# Patient Record
Sex: Male | Born: 1965 | Race: White | Hispanic: No | Marital: Married | Smoking: Current some day smoker
Health system: Southern US, Community
[De-identification: ages and names within clinical notes are randomized; demographics above are authoritative.]

## PROBLEM LIST (undated history)

## (undated) DIAGNOSIS — I491 Atrial premature depolarization: Secondary | ICD-10-CM

## (undated) DIAGNOSIS — F419 Anxiety disorder, unspecified: Secondary | ICD-10-CM

## (undated) DIAGNOSIS — E669 Obesity, unspecified: Secondary | ICD-10-CM

## (undated) DIAGNOSIS — E119 Type 2 diabetes mellitus without complications: Secondary | ICD-10-CM

## (undated) DIAGNOSIS — I499 Cardiac arrhythmia, unspecified: Secondary | ICD-10-CM

## (undated) DIAGNOSIS — K2100 Gastro-esophageal reflux disease with esophagitis, without bleeding: Secondary | ICD-10-CM

## (undated) DIAGNOSIS — F329 Major depressive disorder, single episode, unspecified: Secondary | ICD-10-CM

## (undated) DIAGNOSIS — A4902 Methicillin resistant Staphylococcus aureus infection, unspecified site: Secondary | ICD-10-CM

## (undated) DIAGNOSIS — E7800 Pure hypercholesterolemia, unspecified: Secondary | ICD-10-CM

## (undated) DIAGNOSIS — K219 Gastro-esophageal reflux disease without esophagitis: Secondary | ICD-10-CM

## (undated) DIAGNOSIS — F32A Depression, unspecified: Secondary | ICD-10-CM

## (undated) DIAGNOSIS — G4730 Sleep apnea, unspecified: Secondary | ICD-10-CM

## (undated) DIAGNOSIS — M1990 Unspecified osteoarthritis, unspecified site: Secondary | ICD-10-CM

## (undated) DIAGNOSIS — I10 Essential (primary) hypertension: Secondary | ICD-10-CM

## (undated) DIAGNOSIS — M25519 Pain in unspecified shoulder: Secondary | ICD-10-CM

## (undated) DIAGNOSIS — R030 Elevated blood-pressure reading, without diagnosis of hypertension: Secondary | ICD-10-CM

## (undated) DIAGNOSIS — Z720 Tobacco use: Secondary | ICD-10-CM

## (undated) HISTORY — PX: ABDOMINAL HYSTERECTOMY: SHX81

## (undated) HISTORY — PX: HERNIA REPAIR: SHX51

## (undated) HISTORY — DX: Type 2 diabetes mellitus without complications: E11.9

---
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
## 2005-05-20 ENCOUNTER — Emergency Department: Payer: Self-pay | Admitting: Emergency Medicine

## 2006-04-16 ENCOUNTER — Emergency Department: Payer: Self-pay

## 2008-11-11 ENCOUNTER — Emergency Department: Payer: Self-pay | Admitting: Emergency Medicine

## 2011-03-25 ENCOUNTER — Emergency Department: Payer: Self-pay | Admitting: Emergency Medicine

## 2013-12-16 ENCOUNTER — Emergency Department: Payer: Self-pay | Admitting: Emergency Medicine
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
## 2019-03-10 ENCOUNTER — Encounter: Payer: Self-pay | Admitting: Emergency Medicine

## 2019-03-10 ENCOUNTER — Ambulatory Visit
Admission: EM | Admit: 2019-03-10 | Discharge: 2019-03-10 | Disposition: A | Discharge status: Home / Self Care | Payer: BC Managed Care – PPO | Attending: Emergency Medicine | Admitting: Emergency Medicine

## 2019-03-10 ENCOUNTER — Other Ambulatory Visit: Payer: Self-pay

## 2019-03-10 DIAGNOSIS — Z20822 Contact with and (suspected) exposure to covid-19: Secondary | ICD-10-CM

## 2019-03-10 DIAGNOSIS — F17210 Nicotine dependence, cigarettes, uncomplicated: Secondary | ICD-10-CM | POA: Diagnosis not present

## 2019-03-10 DIAGNOSIS — I10 Essential (primary) hypertension: Secondary | ICD-10-CM

## 2019-03-10 HISTORY — DX: Gastro-esophageal reflux disease with esophagitis, without bleeding: K21.00

## 2019-03-10 HISTORY — DX: Essential (primary) hypertension: I10

## 2019-03-10 HISTORY — DX: Tobacco use: Z72.0

## 2019-03-10 HISTORY — DX: Type 2 diabetes mellitus without complications: E11.9

## 2019-03-10 HISTORY — DX: Depression, unspecified: F32.A

## 2019-03-10 HISTORY — DX: Obesity, unspecified: E66.9

## 2019-03-10 NOTE — ED Triage Notes (Signed)
Patient in office today stated that he was exposed to a coworker whom tested positive for covid at [facility].   Denies: any symptom

## 2019-03-10 NOTE — ED Provider Notes (Signed)
Andrew Whitehead    CSN: [id number] Arrival date & time: 03/10/19  1027      History   Chief Complaint Chief Complaint  Patient presents with  . Possibel exposure covid    HPI Andrew Whitehead is a 54 y.o. male.  Presents with request for a COVID test.  He was exposed to a positive coworker.  He denies symptoms, including fever, chills, congestion, cough, shortness of breath, vomiting, diarrhea, rash, or other concerns.  No treatments attempted at home.    The history is provided by the patient.    Past Medical History:  Diagnosis Date  . Depression   . Diabetes mellitus without complication (HCC)   . Gastro-esophageal reflux disease with esophagitis   . Hypertension   . Obesity   . Tobacco abuse     There are no problems to display for this patient.   History reviewed. No pertinent surgical history.     Home Medications    Prior to Admission medications   Medication Sig Start Date End Date Taking? Authorizing Provider  amLODipine (NORVASC) 10 MG tablet Take by mouth. 06/11/18  Yes [provider]  aspirin 81 MG EC tablet Take by mouth. 10/04/14  Yes [provider]  Blood Glucose Monitoring Suppl (GLUCOCOM BLOOD GLUCOSE MONITOR) DEVI by Does not apply route. 04/02/18  Yes [provider]  citalopram (CELEXA) 40 MG tablet Take by mouth. 06/11/18 06/11/19 Yes [provider]  esomeprazole (NEXIUM) 20 MG capsule Take by mouth. 06/11/18  Yes [provider]  ezetimibe (ZETIA) 10 MG tablet Take by mouth. 11/07/18  Yes [provider]  glucose blood (FREESTYLE INSULINX TEST) test strip by Other route Three (3) times a day before meals. 06/11/18  Yes [provider]  Insulin Glargine (LANTUS) 100 UNIT/ML Solostar Pen Inject into the skin. 06/11/18 06/11/19 Yes [provider]  Lancets MISC Disp. lancets #100 or amount allowed, Testing TID, Dx: E11, Z79.4. (DM Type 2, Insulin Dep) 06/11/18 06/11/19 Yes  [provider]  liraglutide (VICTOZA) 18 MG/3ML SOPN Inject into the skin. 06/11/18 06/11/19 Yes [provider]  metFORMIN (GLUCOPHAGE) 500 MG tablet Take by mouth. 06/11/18  Yes [provider]    Family History Family History  Problem Relation Age of Onset  . Healthy Mother     Social History Social History   Tobacco Use  . Smoking status: Current Some Day Smoker  . Smokeless tobacco: Never Used  Substance Use Topics  . Alcohol use: Not on file  . Drug use: Not on file     Allergies   Patient has no known allergies.   Review of Systems Review of Systems  Constitutional: Negative for chills and fever.  HENT: Negative for congestion, ear pain, rhinorrhea and sore throat.   Eyes: Negative for pain and visual disturbance.  Respiratory: Negative for cough and shortness of breath.   Cardiovascular: Negative for chest pain and palpitations.  Gastrointestinal: Negative for abdominal pain, diarrhea, nausea and vomiting.  Genitourinary: Negative for dysuria and hematuria.  Musculoskeletal: Negative for arthralgias and back pain.  Skin: Negative for color change and rash.  Neurological: Negative for seizures and syncope.  All other systems reviewed and are negative.    Physical Exam Triage Vital Signs ED Triage Vitals  Enc Vitals Group     BP      Pulse      Resp      Temp      Temp src  SpO2      Weight      Height      Head Circumference      Peak Flow      Pain Score      Pain Loc      Pain Edu?      Excl. in GC?    No data found.  Updated Vital Signs BP (!) 164/89 (BP Location: Left Arm)   Pulse 77   Temp 99.1 F (37.3 C) (Oral)   Resp 18   Ht 5\' 9"  (1.753 m)   Wt 260 lb (117.9 kg)   SpO2 97%   BMI 38.40 kg/m   Visual Acuity Right Eye Distance:   Left Eye Distance:   Bilateral Distance:    Right Eye Near:   Left Eye Near:    Bilateral Near:     Physical Exam Vitals and nursing note reviewed.    Constitutional:      General: He is not in acute distress.    Appearance: He is well-developed. He is not ill-appearing.  HENT:     Head: Normocephalic and atraumatic.     Right Ear: Tympanic membrane normal.     Left Ear: Tympanic membrane normal.     Nose: Nose normal.     Mouth/Throat:     Mouth: Mucous membranes are moist.     Pharynx: Oropharynx is clear.  Eyes:     Conjunctiva/sclera: Conjunctivae normal.  Cardiovascular:     Rate and Rhythm: Normal rate and regular rhythm.     Heart sounds: No murmur.  Pulmonary:     Effort: Pulmonary effort is normal. No respiratory distress.     Breath sounds: Normal breath sounds. No wheezing or rhonchi.  Abdominal:     General: Bowel sounds are normal.     Palpations: Abdomen is soft.     Tenderness: There is no abdominal tenderness. There is no guarding or rebound.  Musculoskeletal:     Cervical back: Neck supple.  Skin:    General: Skin is warm and dry.     Findings: No rash.  Neurological:     General: No focal deficit present.     Mental Status: He is alert and oriented to person, place, and time.  Psychiatric:        Mood and Affect: Mood normal.        Behavior: Behavior normal.      UC Treatments / Results  Labs (all labs ordered are listed, but only abnormal results are displayed) Labs Reviewed  NOVEL CORONAVIRUS, NAA    EKG   Radiology No results found.  Procedures Procedures (including critical care time)  Medications Ordered in UC Medications - No data to display  Initial Impression / Assessment and Plan / UC Course  I have reviewed the triage vital signs and the nursing notes.  Pertinent labs & imaging results that were available during my care of the patient were reviewed by me and considered in my medical decision making (see chart for details).   Exposure to COVID.  Elevated blood pressure with known HTN.  COVID test performed here.  Instructed patient to self quarantine until the test result  is back.  Discussed with patient that he can take Tylenol as needed for fever or discomfort.  Instructed patient to go to the emergency department if he develops high fever, shortness of breath, severe diarrhea, or other concerning symptoms.  Discussed that his blood pressure is elevated today and needs to be  rechecked by his PCP in 2-4 weeks.  Patient agrees with plan of care.    Final Clinical Impressions(s) / UC Diagnoses   Final diagnoses:  Exposure to COVID-19 virus  Elevated blood pressure reading in office with diagnosis of hypertension     Discharge Instructions     Your COVID test is pending.  You should self quarantine until your test result is back and is negative.    Take Tylenol as needed for fever or discomfort.  Rest and keep yourself hydrated.    Go to the emergency department if you develop high fever, shortness of breath, severe diarrhea, or other concerning symptoms.    Your blood pressure is elevated today at 164/89.  Please have this rechecked by your primary care provider in 2-4 weeks.          ED Prescriptions    None     PDMP not reviewed this encounter.   Sharion Balloon, NP 03/10/19 1053

## 2019-03-10 NOTE — Discharge Instructions (Addendum)
Your COVID test is pending.  You should self quarantine until your test result is back and is negative.    Take Tylenol as needed for fever or discomfort.  Rest and keep yourself hydrated.    Go to the emergency department if you develop high fever, shortness of breath, severe diarrhea, or other concerning symptoms.    Your blood pressure is elevated today at 164/89.  Please have this rechecked by your primary care provider in 2-4 weeks.

## 2019-03-11 LAB — NOVEL CORONAVIRUS, NAA: SARS-CoV-2, NAA: NOT DETECTED

## 2022-03-05 ENCOUNTER — Other Ambulatory Visit: Payer: Self-pay | Admitting: Sports Medicine

## 2022-03-05 DIAGNOSIS — W19XXXA Unspecified fall, initial encounter: Secondary | ICD-10-CM

## 2022-03-05 DIAGNOSIS — S46011A Strain of muscle(s) and tendon(s) of the rotator cuff of right shoulder, initial encounter: Secondary | ICD-10-CM

## 2022-03-05 DIAGNOSIS — M7551 Bursitis of right shoulder: Secondary | ICD-10-CM

## 2022-03-05 DIAGNOSIS — M7541 Impingement syndrome of right shoulder: Secondary | ICD-10-CM

## 2022-03-05 DIAGNOSIS — G8929 Other chronic pain: Secondary | ICD-10-CM

## 2022-07-31 ENCOUNTER — Other Ambulatory Visit: Payer: Self-pay | Admitting: Orthopedic Surgery

## 2022-07-31 DIAGNOSIS — M5412 Radiculopathy, cervical region: Secondary | ICD-10-CM

## 2022-07-31 DIAGNOSIS — M5030 Other cervical disc degeneration, unspecified cervical region: Secondary | ICD-10-CM

## 2022-07-31 DIAGNOSIS — M4802 Spinal stenosis, cervical region: Secondary | ICD-10-CM

## 2022-08-02 ENCOUNTER — Other Ambulatory Visit: Payer: Self-pay | Admitting: Orthopedic Surgery

## 2022-08-02 DIAGNOSIS — G8929 Other chronic pain: Secondary | ICD-10-CM

## 2022-08-02 DIAGNOSIS — S46011A Strain of muscle(s) and tendon(s) of the rotator cuff of right shoulder, initial encounter: Secondary | ICD-10-CM

## 2022-08-02 DIAGNOSIS — M7551 Bursitis of right shoulder: Secondary | ICD-10-CM

## 2022-08-02 DIAGNOSIS — W19XXXA Unspecified fall, initial encounter: Secondary | ICD-10-CM

## 2022-08-02 DIAGNOSIS — M7541 Impingement syndrome of right shoulder: Secondary | ICD-10-CM

## 2022-08-16 ENCOUNTER — Ambulatory Visit
Admission: RE | Admit: 2022-08-16 | Discharge: 2022-08-16 | Disposition: A | Discharge status: Home / Self Care | Payer: Managed Care, Other (non HMO) | Source: Ambulatory Visit | Attending: Orthopedic Surgery | Admitting: Orthopedic Surgery

## 2022-08-16 DIAGNOSIS — G8929 Other chronic pain: Secondary | ICD-10-CM

## 2022-08-16 DIAGNOSIS — M5030 Other cervical disc degeneration, unspecified cervical region: Secondary | ICD-10-CM

## 2022-08-16 DIAGNOSIS — S46011A Strain of muscle(s) and tendon(s) of the rotator cuff of right shoulder, initial encounter: Secondary | ICD-10-CM

## 2022-08-16 DIAGNOSIS — M5412 Radiculopathy, cervical region: Secondary | ICD-10-CM

## 2022-08-16 DIAGNOSIS — M4802 Spinal stenosis, cervical region: Secondary | ICD-10-CM

## 2022-09-02 ENCOUNTER — Other Ambulatory Visit: Payer: Self-pay | Admitting: Orthopedic Surgery

## 2022-09-09 ENCOUNTER — Inpatient Hospital Stay
Admission: RE | Admit: 2022-09-09 | Discharge: 2022-09-09 | Disposition: A | Discharge status: Home / Self Care | Payer: Managed Care, Other (non HMO) | Source: Ambulatory Visit

## 2022-09-09 HISTORY — DX: Gastro-esophageal reflux disease without esophagitis: K21.9

## 2022-09-09 HISTORY — DX: Pure hypercholesterolemia, unspecified: E78.00

## 2022-09-09 HISTORY — DX: Methicillin resistant Staphylococcus aureus infection, unspecified site: A49.02

## 2022-09-10 ENCOUNTER — Encounter
Admission: RE | Admit: 2022-09-10 | Discharge: 2022-09-10 | Disposition: A | Discharge status: Home / Self Care | Payer: Managed Care, Other (non HMO) | Source: Ambulatory Visit | Attending: Orthopedic Surgery | Admitting: Orthopedic Surgery

## 2022-09-10 ENCOUNTER — Other Ambulatory Visit: Payer: Self-pay

## 2022-09-10 DIAGNOSIS — Z794 Long term (current) use of insulin: Secondary | ICD-10-CM

## 2022-09-10 DIAGNOSIS — I10 Essential (primary) hypertension: Secondary | ICD-10-CM

## 2022-09-10 NOTE — Patient Instructions (Addendum)
Your procedure is scheduled on: Tuesday 09/17/22 To find out your arrival time, please call [phone] between 1PM - 3PM on: Monday 09/16/22   Report to the Registration Desk on the 1st floor of the [facility]. FREE Valet parking is available.  If your arrival time is 6:00 am, do not arrive before that time as the [facility] entrance doors do not open until 6:00 am.  REMEMBER: Instructions that are not followed completely may result in serious medical risk, up to and including death; or upon the discretion of your surgeon and anesthesiologist your surgery may need to be rescheduled.  Do not eat food after midnight the night before surgery.  No gum chewing or hard candies.  You may however, drink CLEAR liquids up to 2 hours before you are scheduled to arrive for your surgery. Do not drink anything within 2 hours of your scheduled arrival time.  Type 1 and Type 2 diabetics should only drink water.  In addition, your doctor has ordered for you to drink the provided:   Gatorade G2 Drinking this carbohydrate drink up to two hours before surgery helps to reduce insulin resistance and improve patient outcomes. Please complete drinking 2 hours before scheduled arrival time.  One week prior to surgery: Stop Anti-inflammatories (NSAIDS) such as Advil, Aleve, Ibuprofen, Motrin, Naproxen, Naprosyn and Aspirin based products such as Excedrin, Goody's Powder, BC Powder. You may however, continue to take Tylenol if needed for pain up until the day of surgery.  Stop ANY OVER THE COUNTER supplements and vitamins until after surgery.  Continue taking all prescribed medications except: metFORMIN (GLUCOPHAGE) 500 MG tablet, hold for 2 days, last dose Saturday night 09/14/22  TAKE ONLY THESE MEDICATIONS THE MORNING OF SURGERY WITH A SIP OF WATER:  amLODipine (NORVASC) 10 MG tablet famotidine (PEPCID) 20 MG tablet Antacid (take one the night before and one on the morning of surgery - helps to prevent  nausea after surgery.)  ezetimibe (ZETIA) 10 MG tablet  citalopram (CELEXA) 40 MG tablet   No Alcohol for 24 hours before or after surgery.  No Smoking including e-cigarettes for 24 hours before surgery.  No chewable tobacco products for at least 6 hours before surgery.  No nicotine patches on the day of surgery.  Do not use any "recreational" drugs for at least a week (preferably 2 weeks) before your surgery.  Please be advised that the combination of cocaine and anesthesia may have negative outcomes, up to and including death. If you test positive for cocaine, your surgery will be cancelled.  On the morning of surgery brush your teeth with toothpaste and water, you may rinse your mouth with mouthwash if you wish. Do not swallow any toothpaste or mouthwash.  Use CHG Soap or wipes as directed on instruction sheet.  Do not wear lotions, powders, or perfumes.   Do not shave body hair from the neck down 48 hours before surgery.  Wear comfortable clothing (specific to your surgery type) to the hospital.  Do not wear jewelry, make-up, hairpins, clips or nail polish.  Contact lenses, hearing aids and dentures may not be worn into surgery.  Do not bring valuables to the hospital. [facility] is not responsible for any missing/lost belongings or valuables.   Notify your doctor if there is any change in your medical condition (cold, fever, infection).  If you are being discharged the day of surgery, you will not be allowed to drive home. You will need a responsible individual to drive  you home and stay with you for 24 hours after surgery.   If you are taking public transportation, you will need to have a responsible individual with you.  If you are being admitted to the hospital overnight, leave your suitcase in the car. After surgery it may be brought to your room.  In case of increased patient census, it may be necessary for you, the patient, to continue your postoperative care in  the Same Day Surgery department.  After surgery, you can help prevent lung complications by doing breathing exercises.  Take deep breaths and cough every 1-2 hours. Your doctor may order a device called an Incentive Spirometer to help you take deep breaths. When coughing or sneezing, hold a pillow firmly against your incision with both hands. This is called "splinting." Doing this helps protect your incision. It also decreases belly discomfort.  Surgery Visitation Policy:  Patients undergoing a surgery or procedure may have two family members or support persons with them as long as the person is not COVID-19 positive or experiencing its symptoms.   Inpatient Visitation:    Visiting hours are 7 a.m. to 8 p.m. Up to four visitors are allowed at one time in a patient room. The visitors may rotate out with other people during the day. One designated support person (adult) may remain overnight.  Due to an increase in RSV and influenza rates and associated hospitalizations, children ages 10 and under will not be able to visit patients in [facility]. Masks continue to be strongly recommended.  Please call the Pre-admissions Testing Dept. at [phone] if you have any questions about these instructions.     Preparing for Surgery with CHLORHEXIDINE GLUCONATE (CHG) Soap  Chlorhexidine Gluconate (CHG) Soap  o An antiseptic cleaner that kills germs and bonds with the skin to continue killing germs even after washing  o Used for showering the night before surgery and morning of surgery  Before surgery, you can play an important role by reducing the number of germs on your skin.  CHG (Chlorhexidine gluconate) soap is an antiseptic cleanser which kills germs and bonds with the skin to continue killing germs even after washing.  Please do not use if you have an allergy to CHG or antibacterial soaps. If your skin becomes reddened/irritated stop using the CHG.  1. Shower the NIGHT  BEFORE SURGERY and the MORNING OF SURGERY with CHG soap.  2. If you choose to wash your hair, wash your hair first as usual with your normal shampoo.  3. After shampooing, rinse your hair and body thoroughly to remove the shampoo.  4. Use CHG as you would any other liquid soap. You can apply CHG directly to the skin and wash gently with a scrungie or a clean washcloth.  5. Apply the CHG soap to your body only from the neck down. Do not use on open wounds or open sores. Avoid contact with your eyes, ears, mouth, and genitals (private parts). Wash face and genitals (private parts) with your normal soap.  6. Wash thoroughly, paying special attention to the area where your surgery will be performed.  7. Thoroughly rinse your body with warm water.  8. Do not shower/wash with your normal soap after using and rinsing off the CHG soap.  9. Pat yourself dry with a clean towel.  10. Wear clean pajamas to bed the night before surgery.  12. Place clean sheets on your bed the night of your first shower and do not sleep  with pets.  13. Shower again with the CHG soap on the day of surgery prior to arriving at the hospital.  14. Do not apply any deodorants/lotions/powders.  15. Please wear clean clothes to the hospital.

## 2022-09-11 ENCOUNTER — Encounter: Payer: Self-pay | Admitting: Urgent Care

## 2022-09-11 ENCOUNTER — Encounter
Admission: RE | Admit: 2022-09-11 | Discharge: 2022-09-11 | Disposition: A | Discharge status: Home / Self Care | Payer: Managed Care, Other (non HMO) | Source: Ambulatory Visit | Attending: Orthopedic Surgery | Admitting: Orthopedic Surgery

## 2022-09-11 DIAGNOSIS — E119 Type 2 diabetes mellitus without complications: Secondary | ICD-10-CM | POA: Diagnosis not present

## 2022-09-11 DIAGNOSIS — I10 Essential (primary) hypertension: Secondary | ICD-10-CM | POA: Diagnosis not present

## 2022-09-11 DIAGNOSIS — Z01818 Encounter for other preprocedural examination: Secondary | ICD-10-CM | POA: Diagnosis present

## 2022-09-11 DIAGNOSIS — Z01810 Encounter for preprocedural cardiovascular examination: Secondary | ICD-10-CM | POA: Insufficient documentation

## 2022-09-11 DIAGNOSIS — Z794 Long term (current) use of insulin: Secondary | ICD-10-CM | POA: Insufficient documentation

## 2022-09-17 ENCOUNTER — Encounter: Payer: Self-pay | Admitting: Orthopedic Surgery

## 2022-09-17 ENCOUNTER — Ambulatory Visit: Payer: Managed Care, Other (non HMO) | Admitting: Urgent Care

## 2022-09-17 ENCOUNTER — Other Ambulatory Visit: Payer: Self-pay

## 2022-09-17 ENCOUNTER — Ambulatory Visit
Admission: RE | Admit: 2022-09-17 | Discharge: 2022-09-17 | Disposition: A | Discharge status: Home / Self Care | Payer: Managed Care, Other (non HMO) | Attending: Orthopedic Surgery | Admitting: Orthopedic Surgery

## 2022-09-17 ENCOUNTER — Encounter
Admission: RE | Disposition: A | Discharge status: Home / Self Care | Payer: Self-pay | Source: Home / Self Care | Attending: Orthopedic Surgery

## 2022-09-17 ENCOUNTER — Ambulatory Visit: Payer: Managed Care, Other (non HMO)

## 2022-09-17 DIAGNOSIS — I10 Essential (primary) hypertension: Secondary | ICD-10-CM | POA: Insufficient documentation

## 2022-09-17 DIAGNOSIS — M7521 Bicipital tendinitis, right shoulder: Secondary | ICD-10-CM | POA: Diagnosis not present

## 2022-09-17 DIAGNOSIS — S46019A Strain of muscle(s) and tendon(s) of the rotator cuff of unspecified shoulder, initial encounter: Secondary | ICD-10-CM | POA: Diagnosis present

## 2022-09-17 DIAGNOSIS — Z6836 Body mass index (BMI) 36.0-36.9, adult: Secondary | ICD-10-CM | POA: Diagnosis not present

## 2022-09-17 DIAGNOSIS — K219 Gastro-esophageal reflux disease without esophagitis: Secondary | ICD-10-CM | POA: Insufficient documentation

## 2022-09-17 DIAGNOSIS — E119 Type 2 diabetes mellitus without complications: Secondary | ICD-10-CM | POA: Insufficient documentation

## 2022-09-17 DIAGNOSIS — M19011 Primary osteoarthritis, right shoulder: Secondary | ICD-10-CM | POA: Insufficient documentation

## 2022-09-17 DIAGNOSIS — E669 Obesity, unspecified: Secondary | ICD-10-CM | POA: Diagnosis not present

## 2022-09-17 DIAGNOSIS — W010XXA Fall on same level from slipping, tripping and stumbling without subsequent striking against object, initial encounter: Secondary | ICD-10-CM | POA: Insufficient documentation

## 2022-09-17 DIAGNOSIS — Z794 Long term (current) use of insulin: Secondary | ICD-10-CM

## 2022-09-17 HISTORY — PX: OTHER SURGICAL HISTORY: SHX169

## 2022-09-17 LAB — GLUCOSE, CAPILLARY
Glucose-Capillary: 206 mg/dL — ABNORMAL HIGH (ref 70–99)
Glucose-Capillary: 166 mg/dL — ABNORMAL HIGH (ref 70–99)

## 2022-09-17 SURGERY — SHOULDER ARTHROSCOPY WITH SUBACROMIAL DECOMPRESSION AND DISTAL CLAVICLE EXCISION
Anesthesia: General | Site: Shoulder | Laterality: Right

## 2022-09-17 MED ORDER — ACETAMINOPHEN 500 MG PO TABS: 1000.0000 mg | ORAL_TABLET | Freq: Three times a day (TID) | ORAL | 2 refills | Status: AC

## 2022-09-17 MED ORDER — CHLORHEXIDINE GLUCONATE 0.12 % MT SOLN
15.0000 mL | Freq: Once | OROMUCOSAL | Status: AC
  Administered 2022-09-17: 15 mL via OROMUCOSAL

## 2022-09-17 MED ORDER — OXYCODONE HCL 5 MG PO TABS: 5.0000 mg | ORAL_TABLET | Freq: Once | ORAL | Status: DC | PRN

## 2022-09-17 MED ORDER — INSULIN ASPART 100 UNIT/ML IJ SOLN
INTRAMUSCULAR | Status: AC
  Filled 2022-09-17: qty 1

## 2022-09-17 MED ORDER — DEXAMETHASONE SODIUM PHOSPHATE 10 MG/ML IJ SOLN
INTRAMUSCULAR | Status: AC
  Filled 2022-09-17: qty 1

## 2022-09-17 MED ORDER — MIDAZOLAM HCL 2 MG/2ML IJ SOLN
INTRAMUSCULAR | Status: AC
  Filled 2022-09-17: qty 2

## 2022-09-17 MED ORDER — LACTATED RINGERS IR SOLN
Status: DC | PRN
  Administered 2022-09-17: 12004 mL

## 2022-09-17 MED ORDER — CEFAZOLIN SODIUM-DEXTROSE 2-4 GM/100ML-% IV SOLN
INTRAVENOUS | Status: AC
  Filled 2022-09-17: qty 100

## 2022-09-17 MED ORDER — FENTANYL CITRATE PF 50 MCG/ML IJ SOSY
PREFILLED_SYRINGE | INTRAMUSCULAR | Status: AC
  Filled 2022-09-17: qty 1

## 2022-09-17 MED ORDER — ACETAMINOPHEN 10 MG/ML IV SOLN
INTRAVENOUS | Status: AC
  Filled 2022-09-17: qty 100

## 2022-09-17 MED ORDER — SUGAMMADEX SODIUM 200 MG/2ML IV SOLN
INTRAVENOUS | Status: DC | PRN
  Administered 2022-09-17: 200 mg via INTRAVENOUS

## 2022-09-17 MED ORDER — ONDANSETRON HCL 4 MG/2ML IJ SOLN
INTRAMUSCULAR | Status: DC | PRN
Start: 2022-09-17 — End: 2022-09-17
  Administered 2022-09-17: 4 mg via INTRAVENOUS

## 2022-09-17 MED ORDER — BUPIVACAINE HCL (PF) 0.5 % IJ SOLN
INTRAMUSCULAR | Status: AC
  Filled 2022-09-17: qty 10

## 2022-09-17 MED ORDER — OXYCODONE HCL 5 MG PO TABS: 5.0000 mg | ORAL_TABLET | ORAL | 0 refills | Status: DC | PRN

## 2022-09-17 MED ORDER — SEVOFLURANE IN SOLN
RESPIRATORY_TRACT | Status: AC
  Filled 2022-09-17: qty 250

## 2022-09-17 MED ORDER — PROPOFOL 10 MG/ML IV BOLUS
INTRAVENOUS | Status: DC | PRN
  Administered 2022-09-17: 50 mg via INTRAVENOUS
  Administered 2022-09-17: 150 mg via INTRAVENOUS

## 2022-09-17 MED ORDER — LACTATED RINGERS IR SOLN
Status: DC | PRN
  Administered 2022-09-17: 36000 mL

## 2022-09-17 MED ORDER — PHENYLEPHRINE HCL (PRESSORS) 10 MG/ML IV SOLN
INTRAVENOUS | Status: DC | PRN
  Administered 2022-09-17: 160 ug via INTRAVENOUS
  Administered 2022-09-17: 80 ug via INTRAVENOUS

## 2022-09-17 MED ORDER — ASPIRIN 325 MG PO TBEC: 325.0000 mg | DELAYED_RELEASE_TABLET | Freq: Every day | ORAL | 0 refills | Status: AC

## 2022-09-17 MED ORDER — MIDAZOLAM HCL 2 MG/2ML IJ SOLN
INTRAMUSCULAR | Status: DC | PRN
  Administered 2022-09-17: 2 mg via INTRAVENOUS

## 2022-09-17 MED ORDER — SODIUM CHLORIDE 0.9 % IV SOLN: INTRAVENOUS | Status: DC

## 2022-09-17 MED ORDER — BUPIVACAINE HCL (PF) 0.5 % IJ SOLN
INTRAMUSCULAR | Status: DC | PRN
  Administered 2022-09-17: 10 mL

## 2022-09-17 MED ORDER — FENTANYL CITRATE (PF) 100 MCG/2ML IJ SOLN
INTRAMUSCULAR | Status: DC | PRN
  Administered 2022-09-17: 50 ug via INTRAVENOUS
  Administered 2022-09-17: 25 ug via INTRAVENOUS

## 2022-09-17 MED ORDER — EPINEPHRINE PF 1 MG/ML IJ SOLN
INTRAMUSCULAR | Status: AC
  Filled 2022-09-17: qty 5

## 2022-09-17 MED ORDER — EPHEDRINE SULFATE (PRESSORS) 50 MG/ML IJ SOLN
INTRAMUSCULAR | Status: DC | PRN
  Administered 2022-09-17: 10 mg via INTRAVENOUS

## 2022-09-17 MED ORDER — INSULIN ASPART 100 UNIT/ML IJ SOLN
5.0000 [IU] | Freq: Once | INTRAMUSCULAR | Status: AC
  Administered 2022-09-17: 5 [IU] via SUBCUTANEOUS

## 2022-09-17 MED ORDER — DEXAMETHASONE SODIUM PHOSPHATE 10 MG/ML IJ SOLN
INTRAMUSCULAR | Status: DC | PRN
  Administered 2022-09-17: 10 mg via INTRAVENOUS

## 2022-09-17 MED ORDER — CHLORHEXIDINE GLUCONATE 0.12 % MT SOLN
OROMUCOSAL | Status: AC
  Filled 2022-09-17: qty 15

## 2022-09-17 MED ORDER — PHENYLEPHRINE HCL-NACL 20-0.9 MG/250ML-% IV SOLN
INTRAVENOUS | Status: AC
  Filled 2022-09-17: qty 250

## 2022-09-17 MED ORDER — ROCURONIUM BROMIDE 10 MG/ML (PF) SYRINGE
PREFILLED_SYRINGE | INTRAVENOUS | Status: AC
  Filled 2022-09-17: qty 10

## 2022-09-17 MED ORDER — PHENYLEPHRINE HCL-NACL 20-0.9 MG/250ML-% IV SOLN
INTRAVENOUS | Status: DC | PRN
  Administered 2022-09-17: 25 ug/min via INTRAVENOUS

## 2022-09-17 MED ORDER — GLYCOPYRROLATE 0.2 MG/ML IJ SOLN
INTRAMUSCULAR | Status: DC | PRN
  Administered 2022-09-17: .2 mg via INTRAVENOUS

## 2022-09-17 MED ORDER — BUPIVACAINE LIPOSOME 1.3 % IJ SUSP
INTRAMUSCULAR | Status: AC
  Filled 2022-09-17: qty 20

## 2022-09-17 MED ORDER — DEXMEDETOMIDINE HCL IN NACL 80 MCG/20ML IV SOLN
INTRAVENOUS | Status: DC | PRN
Start: 2022-09-17 — End: 2022-09-17
  Administered 2022-09-17: 6 ug via INTRAVENOUS

## 2022-09-17 MED ORDER — FENTANYL CITRATE (PF) 100 MCG/2ML IJ SOLN: 25.0000 ug | INTRAMUSCULAR | Status: DC | PRN

## 2022-09-17 MED ORDER — FENTANYL CITRATE (PF) 100 MCG/2ML IJ SOLN
INTRAMUSCULAR | Status: AC
  Filled 2022-09-17: qty 2

## 2022-09-17 MED ORDER — LIDOCAINE HCL (PF) 2 % IJ SOLN
INTRAMUSCULAR | Status: AC
  Filled 2022-09-17: qty 5

## 2022-09-17 MED ORDER — CEFAZOLIN SODIUM-DEXTROSE 2-4 GM/100ML-% IV SOLN
2.0000 g | INTRAVENOUS | Status: AC
  Administered 2022-09-17: 2 g via INTRAVENOUS

## 2022-09-17 MED ORDER — BUPIVACAINE HCL (PF) 0.5 % IJ SOLN
INTRAMUSCULAR | Status: AC
  Filled 2022-09-17: qty 30

## 2022-09-17 MED ORDER — LIDOCAINE HCL (CARDIAC) PF 100 MG/5ML IV SOSY: PREFILLED_SYRINGE | INTRAVENOUS | Status: DC | PRN

## 2022-09-17 MED ORDER — ONDANSETRON 4 MG PO TBDP: 4.0000 mg | ORAL_TABLET | Freq: Three times a day (TID) | ORAL | 0 refills | Status: AC | PRN

## 2022-09-17 MED ORDER — ACETAMINOPHEN 10 MG/ML IV SOLN
INTRAVENOUS | Status: DC | PRN
  Administered 2022-09-17: 1000 mg via INTRAVENOUS

## 2022-09-17 MED ORDER — MIDAZOLAM HCL 2 MG/2ML IJ SOLN
1.0000 mg | INTRAMUSCULAR | Status: DC | PRN
  Administered 2022-09-17: 1 mg via INTRAVENOUS

## 2022-09-17 MED ORDER — LIDOCAINE HCL (PF) 1 % IJ SOLN
INTRAMUSCULAR | Status: AC
  Filled 2022-09-17: qty 30

## 2022-09-17 MED ORDER — ORAL CARE MOUTH RINSE: 15.0000 mL | Freq: Once | OROMUCOSAL | Status: AC

## 2022-09-17 MED ORDER — ONDANSETRON HCL 4 MG/2ML IJ SOLN
INTRAMUSCULAR | Status: AC
  Filled 2022-09-17: qty 2

## 2022-09-17 MED ORDER — FENTANYL CITRATE PF 50 MCG/ML IJ SOSY
50.0000 ug | PREFILLED_SYRINGE | Freq: Once | INTRAMUSCULAR | Status: AC
  Administered 2022-09-17: 50 ug via INTRAVENOUS

## 2022-09-17 MED ORDER — ROCURONIUM BROMIDE 100 MG/10ML IV SOLN
INTRAVENOUS | Status: DC | PRN
  Administered 2022-09-17: 60 mg via INTRAVENOUS
  Administered 2022-09-17: 10 mg via INTRAVENOUS

## 2022-09-17 MED ORDER — PROPOFOL 10 MG/ML IV BOLUS
INTRAVENOUS | Status: AC
  Filled 2022-09-17: qty 20

## 2022-09-17 MED ORDER — OXYCODONE HCL 5 MG/5ML PO SOLN: 5.0000 mg | Freq: Once | ORAL | Status: DC | PRN

## 2022-09-17 MED ORDER — BUPIVACAINE LIPOSOME 1.3 % IJ SUSP
INTRAMUSCULAR | Status: DC | PRN
Start: 2022-09-17 — End: 2022-09-17
  Administered 2022-09-17: 20 mL

## 2022-09-17 SURGICAL SUPPLY — 100 items
ADAPTER IRRIG TUBE 2 SPIKE SOL (ADAPTER) ×2 IMPLANT
ADH SKN CLS APL DERMABOND .7 (GAUZE/BANDAGES/DRESSINGS)
ADPR TBG 2 SPK PMP STRL ASCP (ADAPTER) ×2
ANCH SUT 2 SUTTK 14.5X3 (Anchor) ×1 IMPLANT
ANCH SUT 2 SWLK 19.1 CLS EYLT (Anchor) ×4 IMPLANT
ANCH SUT 2.9 PUSHLOCK ANCH (Orthopedic Implant) ×1 IMPLANT
ANCH SUT 3.5 SELFPUNCH STRL (Anchor) ×1 IMPLANT
ANCH SUT 5 FBRTK 2.6 KNTLS SLF (Anchor) ×2 IMPLANT
ANCHOR PSHLK BCMP PEK 3.5X19.5 (Anchor) IMPLANT
ANCHOR PUSHLOCK PEEK 3.5X19.5 (Anchor) ×1 IMPLANT
ANCHOR SUT FBRTK 2.6 SP #5 (Anchor) IMPLANT
ANCHOR SWIVELOCK BIO 4.75X19.1 (Anchor) IMPLANT
APL PRP STRL LF DISP 70% ISPRP (MISCELLANEOUS) ×1
BLADE SHAVER 4.5X7 STR FR (MISCELLANEOUS) ×1 IMPLANT
BRUSH SCRUB EZ 4% CHG (MISCELLANEOUS) ×1 IMPLANT
BUR BR 5.5 WIDE MOUTH (BURR) IMPLANT
CANN PASSPORT 10X5 (MISCELLANEOUS) ×1
CANNULA BUTTON PASSPORT (MISCELLANEOUS) IMPLANT
CANNULA PART THRD DISP 5.75X7 (CANNULA) IMPLANT
CANNULA PARTIAL THREAD 2X7 (CANNULA) ×1 IMPLANT
CANNULA PASSPORT 10X5 (MISCELLANEOUS) IMPLANT
CANNULA TWIST IN 8.25X7CM (CANNULA) IMPLANT
CANNULA TWIST IN 8.25X9CM (CANNULA) IMPLANT
CHLORAPREP W/TINT 26 (MISCELLANEOUS) ×1 IMPLANT
COOLER POLAR GLACIER W/PUMP (MISCELLANEOUS) ×1 IMPLANT
DERMABOND ADVANCED .7 DNX12 (GAUZE/BANDAGES/DRESSINGS) IMPLANT
DEVICE SUCT BLK HOLE OR FLOOR (MISCELLANEOUS) ×2 IMPLANT
DIVIDER PASSPORT (MISCELLANEOUS) IMPLANT
DRAPE INCISE IOBAN 66X45 STRL (DRAPES) ×1 IMPLANT
DRAPE SHEET LG 3/4 BI-LAMINATE (DRAPES) ×1 IMPLANT
DRAPE STERI 35X30 U-POUCH (DRAPES) ×1 IMPLANT
DRAPE U-SHAPE 47X51 STRL (DRAPES) ×2 IMPLANT
DRSG TEGADERM 4X4.75 (GAUZE/BANDAGES/DRESSINGS) ×2 IMPLANT
ELECT REM PT RETURN 9FT ADLT (ELECTROSURGICAL)
ELECTRODE REM PT RTRN 9FT ADLT (ELECTROSURGICAL) IMPLANT
GAUZE SPONGE 4X4 12PLY STRL (GAUZE/BANDAGES/DRESSINGS) ×1 IMPLANT
GAUZE XEROFORM 1X8 LF (GAUZE/BANDAGES/DRESSINGS) ×1 IMPLANT
GLOVE BIO SURGEON STRL SZ7.5 (GLOVE) ×1 IMPLANT
GLOVE BIOGEL PI IND STRL 8 (GLOVE) ×2 IMPLANT
GLOVE SURG ORTHO 8.0 STRL STRW (GLOVE) ×1 IMPLANT
GLOVE SURG SYN 7.5 E (GLOVE) ×1
GLOVE SURG SYN 7.5 PF PI (GLOVE) ×1 IMPLANT
GOWN STRL REUS W/ TWL LRG LVL3 (GOWN DISPOSABLE) ×2 IMPLANT
GOWN STRL REUS W/ TWL XL LVL3 (GOWN DISPOSABLE) ×1 IMPLANT
GOWN STRL REUS W/TWL LRG LVL3 (GOWN DISPOSABLE) ×2
GOWN STRL REUS W/TWL LRG LVL4 (GOWN DISPOSABLE) ×1 IMPLANT
GOWN STRL REUS W/TWL XL LVL3 (GOWN DISPOSABLE) ×1
GRAFT TISS 40X70 3 THK DERM (Tissue) IMPLANT
IV LACTATED RINGER IRRG 3000ML (IV SOLUTION) ×16
IV LR IRRIG 3000ML ARTHROMATIC (IV SOLUTION) ×4 IMPLANT
KIT STABILIZATION SHOULDER (MISCELLANEOUS) ×1 IMPLANT
KIT SUTURETAK 3.0 INSERT PERC (KITS) ×1 IMPLANT
KIT TURNOVER KIT A (KITS) ×1 IMPLANT
MANIFOLD NEPTUNE II (INSTRUMENTS) ×2 IMPLANT
MASK FACE SPIDER DISP (MASK) ×1 IMPLANT
MAT ABSORB FLUID 56X50 GRAY (MISCELLANEOUS) ×2 IMPLANT
NDL HYPO 22X1.5 SAFETY MO (MISCELLANEOUS) ×1 IMPLANT
NDL SAFETY ECLIP 18X1.5 (MISCELLANEOUS) ×1 IMPLANT
NDL SCORPION MULTI FIRE (NEEDLE) IMPLANT
NEEDLE HYPO 22X1.5 SAFETY MO (MISCELLANEOUS) ×1
NEEDLE SCORPION MULTI FIRE (NEEDLE) ×1
NS IRRIG 500ML POUR BTL (IV SOLUTION) ×1 IMPLANT
PACK ARTHROSCOPY SHOULDER (MISCELLANEOUS) ×1 IMPLANT
PAD ABD DERMACEA PRESS 5X9 (GAUZE/BANDAGES/DRESSINGS) ×1 IMPLANT
PAD ARMBOARD 7.5X6 YLW CONV (MISCELLANEOUS) ×1 IMPLANT
PAD WRAPON POLAR SHDR XLG (MISCELLANEOUS) ×1 IMPLANT
PASSER SUT FIRSTPASS SELF (INSTRUMENTS) ×1 IMPLANT
PROBE MEASUREMENT AR SCOPE 60D (MISCELLANEOUS) IMPLANT
SHAVER BLADE BONE CUTTER 5.5 (BLADE) IMPLANT
SLEEVE REMOTE CONTROL 5X12 (DRAPES) IMPLANT
SLING ULTRA II M (MISCELLANEOUS) ×1 IMPLANT
SPONGE T-LAP 18X18 ~~LOC~~+RFID (SPONGE) ×1 IMPLANT
STRAP SAFETY 5IN WIDE (MISCELLANEOUS) ×1 IMPLANT
SUT ETHILON 3-0 FS-10 30 BLK (SUTURE) ×1
SUT LASSO 90 DEG SD STR (SUTURE) ×1 IMPLANT
SUT MNCRL 4-0 (SUTURE) ×1
SUT MNCRL 4-0 27XMFL (SUTURE) ×1
SUT PDS AB 0 CT1 27 (SUTURE) IMPLANT
SUT VIC AB 0 CT1 36 (SUTURE) ×1 IMPLANT
SUT VIC AB 2-0 CT2 27 (SUTURE) ×1 IMPLANT
SUTURE EHLN 3-0 FS-10 30 BLK (SUTURE) ×1 IMPLANT
SUTURE MNCRL 4-0 27XMF (SUTURE) ×1 IMPLANT
SUTURE TAPE 1.3 40 TPR END (SUTURE) IMPLANT
SUTURE TAPE FIBERLINK 1.3 LOOP (SUTURE) IMPLANT
SUTURE TAPE TIGERLINK 1.3MM BL (SUTURE) IMPLANT
SUTURETAPE 1.3 40 TPR END (SUTURE) ×2
SUTURETAPE FIBERLINK 1.3 LOOP (SUTURE) ×4
SUTURETAPE TIGERLINK 1.3MM BL (SUTURE) ×2
SYR 10ML LL (SYRINGE) ×1 IMPLANT
SYSTEM IMPL TENODESIS LNT 2.9 (Orthopedic Implant) IMPLANT
TAPE CLOTH 3X10 WHT NS LF (GAUZE/BANDAGES/DRESSINGS) ×1 IMPLANT
TISSUE ARTHOFLEX THICK 3MM (Tissue) ×1 IMPLANT
TRAP FLUID SMOKE EVACUATOR (MISCELLANEOUS) ×1 IMPLANT
TUBE SET DOUBLEFLO INFLOW (TUBING) ×1 IMPLANT
TUBE SET DOUBLEFLO OUTFLOW (TUBING) ×1 IMPLANT
TUBING CONNECTING 10 (TUBING) ×1 IMPLANT
WAND WEREWOLF FLOW 90D (MISCELLANEOUS) ×1 IMPLANT
WATER STERILE IRR 500ML POUR (IV SOLUTION) ×1 IMPLANT
WRAP SHOULDER HOT/COLD PACK (SOFTGOODS) ×1 IMPLANT
WRAPON POLAR PAD SHDR XLG (MISCELLANEOUS) ×1

## 2022-09-17 NOTE — H&P (Signed)
Paper H&P to be scanned into permanent record. H&P reviewed. No significant changes noted.  

## 2022-09-17 NOTE — H&P (Deleted)
Paper H&P to be scanned into permanent record. H&P reviewed. No significant changes noted.  

## 2022-09-17 NOTE — Anesthesia Procedure Notes (Signed)
Procedure Name: Intubation Date/Time: 09/17/2022 10:18 AM  Performed by: Ginger Carne, CRNAPre-anesthesia Checklist: Patient identified, Patient being monitored, Timeout performed, Emergency Drugs available and Suction available Patient Re-evaluated:Patient Re-evaluated prior to induction Oxygen Delivery Method: Circle system utilized Preoxygenation: Pre-oxygenation with 100% oxygen Induction Type: IV induction Ventilation: Mask ventilation without difficulty Laryngoscope Size: Mac, 4 and McGraph Grade View: Grade I Tube type: Oral Tube size: 7.5 mm Number of attempts: 1 Airway Equipment and Method: Stylet Placement Confirmation: ETT inserted through vocal cords under direct vision, positive ETCO2 and breath sounds checked- equal and bilateral Secured at: 23 cm Tube secured with: Tape Dental Injury: Teeth and Oropharynx as per pre-operative assessment

## 2022-09-17 NOTE — Discharge Instructions (Addendum)
AMBULATORY SURGERY  DISCHARGE INSTRUCTIONS   The drugs that you were given will stay in your system until tomorrow so for the next 24 hours you should not:  Drive an automobile Make any legal decisions Drink any alcoholic beverage   You may resume regular meals tomorrow.  Today it is better to start with liquids and gradually work up to solid foods.  You may eat anything you prefer, but it is better to start with liquids, then soup and crackers, and gradually work up to solid foods.   Please notify your doctor immediately if you have any unusual bleeding, trouble breathing, redness and pain at the surgery site, drainage, fever, or pain not relieved by medication.    Additional Instructions: PLEASE LEAVE EXPAREL (TEAL) ARMBAND ON FOR 4 DAYS    Please contact your physician with any problems or Same Day Surgery at [phone], Monday through Friday 6 am to 4 pm, or [place] at [facility] number at [phone].     Interscalene Nerve Block with Exparel   For your surgery you have received an Interscalene Nerve Block with Exparel. Nerve Blocks affect many types of nerves, including nerves that control movement, pain and normal sensation.  You may experience feelings such as numbness, tingling, heaviness, weakness or the inability to move your arm or the feeling or sensation that your arm has "fallen asleep". A nerve block with Exparel can last up to 5 days.  Usually the weakness wears off first.  The tingling and heaviness usually wear off next.  Finally you may start to notice pain.  Keep in mind that this may occur in any order.  Once a nerve block starts to wear off it is usually completely gone within 60 minutes. ISNB may cause mild shortness of breath, a hoarse voice, blurry vision, unequal pupils, or drooping of the face on the same side as the nerve block.  These symptoms will usually resolve with the numbness.  Very rarely the procedure itself can cause mild seizures. If  needed, your surgeon will give you a prescription for pain medication.  It will take about 60 minutes for the oral pain medication to become fully effective.  So, it is recommended that you start taking this medication before the nerve block first begins to wear off, or when you first begin to feel discomfort. Take your pain medication only as prescribed.  Pain medication can cause sedation and decrease your breathing if you take more than you need for the level of pain that you have. Nausea is a common side effect of many pain medications.  You may want to eat something before taking your pain medicine to prevent nausea. After an Interscalene nerve block, you cannot feel pain, pressure or extremes in temperature in the effected arm.  Because your arm is numb it is at an increased risk for injury.  To decrease the possibility of injury, please practice the following:  While you are awake change the position of your arm frequently to prevent too much pressure on any one area for prolonged periods of time.  If you have a cast or tight dressing, check the color or your fingers every couple of hours.  Call your surgeon with the appearance of any discoloration (white or blue). If you are given a sling to wear before you go home, please wear it  at all times until the block has completely worn off.  Do not get up at night without your sling. Please contact [facility]est[facility]  or your surgeon if you do not begin to regain sensation after 7 days from the surgery.  Anesthesia may be contacted by calling the Same Day Surgery Department, Mon. through Fri., 6 am to 4 pm at [phone].   If you experience any other problems or concerns, please contact your surgeon's office. If you experience severe or prolonged shortness of breath go to the nearest emergency department.  POLAR CARE INFORMATION  MassAdvertisement.it  How to use B[employer] Polar Care [facility] Therapy System?  YouTube    ShippingScam.co.uk  OPERATING INSTRUCTIONS  Start the product With dry hands, connect the transformer to the electrical connection located on the top of the cooler. Next, plug the transformer into an appropriate electrical outlet. The unit will automatically start running at this point.  To stop the pump, disconnect electrical power.  Unplug to stop the product when not in use. Unplugging the Polar Care unit turns it off. Always unplug immediately after use. Never leave it plugged in while unattended. Remove pad.    FIRST ADD WATER TO FILL LINE, THEN ICE---Replace ice when existing ice is almost melted  1 Discuss Treatment with your Licensed Health Care Practitioner and Use Only as Prescribed 2 Apply Insulation Barrier & Cold Therapy Pad 3 Check for Moisture 4 Inspect Skin Regularly  Tips and Trouble Shooting Usage Tips 1. Use cubed or chunked ice for optimal performance. 2. It is recommended to drain the Pad between uses. To drain the pad, hold the Pad upright with the hose pointed toward the ground. Depress the black plunger and allow water to drain out. 3. You may disconnect the Pad from the unit without removing the pad from the affected area by depressing the silver tabs on the hose coupling and gently pulling the hoses apart. The Pad and unit will seal itself and will not leak. Note: Some dripping during release is normal. 4. DO NOT RUN PUMP WITHOUT WATER! The pump in this unit is designed to run with water. Running the unit without water will cause permanent damage to the pump. 5. Unplug unit before removing lid.  TROUBLESHOOTING GUIDE Pump not running, Water not flowing to the pad, Pad is not getting cold 1. Make sure the transformer is plugged into the wall outlet. 2. Confirm that the ice and water are filled to the indicated levels. 3. Make sure there are no kinks in the pad. 4. Gently pull on the blue tube to make sure the tube/pad junction is  straight. 5. Remove the pad from the treatment site and ll it while the pad is lying at; then reapply. 6. Confirm that the pad couplings are securely attached to the unit. Listen for the double clicks (Figure 1) to confirm the pad couplings are securely attached.  Leaks    Note: Some condensation on the lines, controller, and pads is unavoidable, especially in warmer climates. 1. If using a Breg Polar Care Cold Therapy unit with a detachable Cold Therapy Pad, and a leak exists (other than condensation on the lines) disconnect the pad couplings. Make sure the silver tabs on the couplings are depressed before reconnecting the pad to the pump hose; then confirm both sides of the coupling are properly clicked in. 2. If the coupling continues to leak or a leak is detected in the pad itself, stop using it and call [employer] Customer Care at [phone].  Cleaning After use, empty and dry the unit with a soft cloth. Warm water and mild detergent may be used  occasionally to clean the pump and tubes.  WARNING: The Polar Care Cube can be cold enough to cause serious injury, including full skin necrosis. Follow these Operating Instructions, and carefully read the Product Insert (see pouch on side of unit) and the Cold Therapy Pad Fitting Instructions (provided with each Cold Therapy Pad) prior to use.  SHOULDER SLING IMMOBILIZER   VIDEO Slingshot 2 Shoulder Brace Application - YouTube ---https://www.porter.info/  INSTRUCTIONS While supporting the injured arm, slide the forearm into the sling. Wrap the adjustable shoulder strap around the neck and shoulders and attach the strap end to the sling using  the "alligator strap tab."  Adjust the shoulder strap to the required length. Position the shoulder pad behind the neck. To secure the shoulder pad location (optional), pull the shoulder strap away from the shoulder pad, unfold the hook material on the top of the pad, then press the shoulder  strap back onto the hook material to secure the pad in place. Attach the closure strap across the open top of the sling. Position the strap so that it holds the arm securely in the sling. Next, attach the thumb strap to the open end of the sling between the thumb and fingers. After sling has been fit, it may be easily removed and reapplied using the quick release buckle on shoulder strap. If a neutral pillow or 15 abduction pillow is included, place the pillow at the waistline. Attach the sling to the pillow, lining up hook material on the pillow with the loop on sling. Adjust the waist strap to fit.  If waist strap is too long, cut it to fit. Use the small piece of double sided hook material (located on top of the pillow) to secure the strap end. Place the double sided hook material on the inside of the cut strap end and secure it to the waist strap.     If no pillow is included, attach the waist strap to the sling and adjust to fit.    Washing Instructions: Straps and sling must be removed and cleaned regularly depending on your activity level and perspiration. Hand wash straps and sling in cold water with mild detergent, rinse, air dry

## 2022-09-17 NOTE — Anesthesia Preprocedure Evaluation (Signed)
Anesthesia Evaluation  Patient identified by MRN, date of birth, ID band Patient awake    Reviewed: Allergy & Precautions, NPO status , Patient's Chart, lab work & pertinent test results  History of Anesthesia Complications Negative for: history of anesthetic complications  Airway Mallampati: III  TM Distance: >3 FB Neck ROM: full    Dental  (+) Dental Advidsory Given, Teeth Intact   Pulmonary neg pulmonary ROS, neg COPD, Current SmokerPatient did not abstain from smoking.   Pulmonary exam normal        Cardiovascular hypertension, On Medications (-) angina negative cardio ROS Normal cardiovascular exam     Neuro/Psych  PSYCHIATRIC DISORDERS  Depression    negative neurological ROS     GI/Hepatic negative GI ROS, Neg liver ROS,,,  Endo/Other  negative endocrine ROSdiabetes    Renal/GU      Musculoskeletal   Abdominal   Peds  Hematology negative hematology ROS (+)   Anesthesia Other Findings Past Medical History: No date: Depression No date: Gastro-esophageal reflux disease with esophagitis No date: GERD (gastroesophageal reflux disease) No date: Hypercholesterolemia No date: Hypertension No date: MRSA infection     Comment:  thigh and tear duct No date: Obesity No date: Tobacco abuse No date: Type 2 diabetes mellitus without complication (HCC)  Past Surgical History: No date: ABDOMINAL HYSTERECTOMY No date: HERNIA REPAIR  BMI    Body Mass Index: 36.62 kg/m      Reproductive/Obstetrics negative OB ROS                             Anesthesia Physical Anesthesia Plan  ASA: 2  Anesthesia Plan: General ETT and General   Post-op Pain Management: Regional block*   Induction: Intravenous  PONV Risk Score and Plan: 2 and Ondansetron, Dexamethasone and Midazolam  Airway Management Planned: Oral ETT  Additional Equipment:   Intra-op Plan:   Post-operative Plan:  Extubation in OR  Informed Consent: I have reviewed the patients History and Physical, chart, labs and discussed the procedure including the risks, benefits and alternatives for the proposed anesthesia with the patient or authorized representative who has indicated his/her understanding and acceptance.     Dental Advisory Given  Plan Discussed with: Anesthesiologist, CRNA and Surgeon  Anesthesia Plan Comments: (Patient consented for risks of anesthesia including but not limited to:  - adverse reactions to medications - damage to eyes, teeth, lips or other oral mucosa - nerve damage due to positioning  - sore throat or hoarseness - Damage to heart, brain, nerves, lungs, other parts of body or loss of life  Patient voiced understanding.)        Anesthesia Quick Evaluation

## 2022-09-17 NOTE — Anesthesia Procedure Notes (Signed)
Anesthesia Regional Block: Interscalene brachial plexus block   Pre-Anesthetic Checklist: , timeout performed,  Correct Patient, Correct Site, Correct Laterality,  Correct Procedure, Correct Position, site marked,  Risks and benefits discussed,  Surgical consent,  Pre-op evaluation,  At surgeon's request and post-op pain management  Laterality: Right  Prep: chloraprep       Needles:  Injection technique: Single-shot  Needle Type: Echogenic Needle     Needle Length: 4cm  Needle Gauge: 25     Additional Needles:   Procedures:,,,, ultrasound used (permanent image in chart),,    Narrative:  Start time: 09/17/2022 9:28 AM End time: 09/17/2022 9:30 AM Injection made incrementally with aspirations every 5 mL.  Performed by: Personally  Anesthesiologist: Stephanie Coup, MD  Additional Notes: Patient's chart reviewed and they were deemed appropriate candidate for procedure, at surgeon's request. Patient educated about risks, benefits, and alternatives of the block including but not limited to: temporary or permanent nerve damage, bleeding, infection, damage to surround tissues, pneumothorax, hemidiaphragmatic paralysis, unilateral Horner's syndrome, block failure, local anesthetic toxicity. Patient expressed understanding. A formal time-out was conducted consistent with institution rules.  Monitors were applied, and minimal sedation used (see nursing record). The site was prepped with skin prep and allowed to dry, and sterile gloves were used. A high frequency linear ultrasound probe with probe cover was utilized throughout. C5-7 nerve roots located and appeared anatomically normal, local anesthetic injected around them, and echogenic block needle trajectory was monitored throughout. Aspiration performed every 5ml. Lung and blood vessels were avoided. All injections were performed without resistance and free of blood and paresthesias. The patient tolerated the procedure well.  Injectate:  20ml exparel + 10ml 0.5% bupivacaine

## 2022-09-17 NOTE — Op Note (Signed)
SURGERY DATE: 09/17/2022   PRE-OP DIAGNOSIS:  1. Right biceps tendinopathy 2. Right rotator cuff tear 3. Right acromioclavicular joint arthritis  POST-OP DIAGNOSIS: 1. Right biceps tendinopathy 2. Right rotator cuff tear 3. Right acromioclavicular joint arthritis  PROCEDURES:  1. Right arthroscopic rotator cuff repair (full thickness upper subscapularis & biologic tuberoplasty) 2. Right arthroscopic biceps tenodesis 3. Right arthroscopic extensive debridement of shoulder (glenohumeral and subacromial spaces) 4. Right arthroscopic distal clavicle excision  SURGEON: Rosealee Albee, MD   ASSISTANT: Sonny Dandy, PA   ANESTHESIA: Gen with Exparel interscalene block   ESTIMATED BLOOD LOSS: 5cc   DRAINS:  none   TOTAL IV FLUIDS: per anesthesia      SPECIMENS: none   IMPLANTS:  - Arthrex 2.18mm PushLock x 2 - Arthrex 4.25mm SwiveLock x 3 - Arthrex Knotless FiberTak RC x 2     OPERATIVE FINDINGS:  Examination under anesthesia: A careful examination under anesthesia was performed.  Passive range of motion was: FF: 150; ER at side: 45; ER in abduction: 90; IR in abduction: 45.  Anterior load shift: NT.  Posterior load shift: NT.  Sulcus in neutral: NT.  Sulcus in ER: NT.     Intra-operative findings: A thorough arthroscopic examination of the shoulder was performed.  The findings are: 1. Biceps tendon: tendinopathy with significant erythema and split-thickness tearing 2. Superior labrum: erythema 3. Posterior labrum and capsule: normal 4. Inferior capsule and inferior recess: normal 5. Glenoid cartilage surface: Grade 3 degenerative changes to superior glenoid 6. Supraspinatus attachment: full-thickness tear of the supraspinatus and anterior infraspinatus with retraction to glenoid 7. Posterior rotator cuff attachment: normal 8. Humeral head articular cartilage: normal 9. Rotator interval: significant synovitis 10: Subscapularis tendon: full-thickness tear of the upper  subscapularis without significant retraction 11. Anterior labrum: Mildly degenerative 12. IGHL: normal   OPERATIVE REPORT:    Indications for procedure:  Andrew Whitehead is a 57 y.o. male with over 1 year of right shoulder pain after he tripped over his dog and landed on his R shoulder with a resultant pop.  He has had difficulty with overhead motion since that time with sensations of weakness.  He has ability to reach overhead, but this is significantly painful.  His work involves a Scientist, product/process development.  He states that he could function with his current level of motion if he had improvement in his pain.  He wishes to avoid an arthroplasty type procedure and continue working if possible.  Clinical exam and MRI were suggestive of massive rotator cuff tear with retraction to the glenoid and associated muscle atrophy, significant biceps tendinopathy, and acromioclavicular joint arthritis. After discussion of risks, benefits, and alternatives to surgery, the patient elected to proceed.    Procedure in detail:   I identified [name] in the pre-operative holding area.  I marked the operative shoulder with my initials. I reviewed the risks and benefits of the proposed surgical intervention, and the patient wished to proceed.  Anesthesia was then performed with an Exparel interscalene block.  The patient was transferred to the operative suite and placed in the beach chair position.     Appropriate IV antibiotics were administered prior to incision. The operative upper extremity was then prepped and draped in standard fashion. A time out was performed confirming the correct extremity, correct patient, and correct procedure.    I then created a standard posterior portal with an 11 blade. The glenohumeral joint was easily entered with a blunt trocar  and the arthroscope introduced. The findings of diagnostic arthroscopy are described above. I debrided degenerative tissue including the synovitic tissue  about the rotator interval and anterior and superior labrum. I then coagulated the inflamed synovium to obtain hemostasis and reduce the risk of post-operative swelling using an Arthrocare radiofrequency device.   I then turned my attention to the arthroscopic biceps tenodesis. The Loop n Tack technique was used to pass a FiberTape through the biceps in a locked fashion adjacent to the biceps anchor.  A hole for a 2.9 mm Arthrex PushLock was drilled in the bicipital groove just superior to the subscapularis tendon insertion.  The biceps tendon was then cut and the biceps anchor complex was debrided down to a stable base on the superior labrum.  The FiberTape was loaded onto the PushLock anchor and impacted into place into the previously drilled hole in the bicipital groove.  This appropriately secured the biceps into the bicipital groove and took it off of tension.   The subscapularis tear was identified.  A superior anterolateral portal was made under needle localization.  A cannula was placed.  The comma tissue indicating the superolateral border of the subscapularis was identified readily.  The tip of the coracoid as well as the conjoined tendon and coracoacromial ligaments were visualized after debriding rotator interval tissue.  Tissue about the subscapularis was released anteriorly, superiorly, and posteriorly to allow for improved mobilization.  It could easily be reduced to its footprint without significant tension.  The lesser tuberosity footprint was prepared with a combination of electrocautery and burr. 2 suture tapes were passed in a mattress fashion.  All 4 strands of suture were then loaded onto a 4.75 mm SwiveLock anchor and placed into the prepared footprint with the arm in a neutral position.  This construct appropriately reduced the subscapularis tear.  The arm was then internally and externally rotated and the subscapularis was noted to move appropriately with rotation.  The remainder of the  suture was then cut.   Next, the arthroscope was then introduced into the subacromial space. A direct lateral portal was created with an 11-blade after spinal needle localization. An extensive subacromial bursectomy and debridement was performed using a combination of the shaver and Arthrocare wand.    I then turned my attention to the arthroscopic distal clavicle excision. I identified the acromioclavicular joint. Surrounding bursal tissue was debrided and the edges of the joint were identified. I used the 5.54mm barrel burr to remove the distal clavicle parallel to the edge of the acromion. I was able to fit two widths of the burr into the space between the distal clavicle and acromion, signifying that I had removed ~94mm of distal clavicle. This was confirmed by viewing anteriorly and introducing a probe with measuring marks from the lateral portal. Hemostasis was achieved with an Arthrocare wand.   I debrided the poor quality edges of the supraspinatus tendon.  This appeared to be a tear of the entire supraspinatus and anterior infraspinatus at the musculotendinous junction.  There was remnant rotator cuff appearing tissue, although of very low quality over the rotator cuff footprint.  The medial stump of rotator cuff could barely be mobilized past the level of the glenoid.  I prepared the footprint using a burr to expose bleeding bone.     Given preoperative discussion with the patient, we agreed to proceed with biologic tuboplasty.  A measuring device was utilized to measure the area of the rotator cuff footprint.  This measured approximately  30 x 25 mm.  A 3 mm dermal allograft was opened and cut to 25 x 20 mm.  It was prepared on the back table with link sutures for the medial row and luggage tag sutures for the lateral row fixation.  I then percutaneously placed a knotless FiberTak RC at the anterior aspect of the rotator cuff footprint along the articular margin.  Similarly, a posterior anchor was  placed.  A passport cannulated with a divider was placed.  Repair stitch and shuttling stitch from each anchor was brought out through the divided cannula, 1 anteriorly, and 1 posteriorly.  Medial row sutures were passed through the graft and then shuttled through the anchor.  The passport cannula divider was removed.  While pulling gentle tension on each of the medial row sutures, the graft was shuttled into the shoulder.  Medial row anchors were provisionally tightened.  Next, the posterior luggage tag stitch was passed through the SwiveLock and this was impacted along the posterior aspect of the greater tuberosity while holding appropriate tension. Similarly, the anterior luggage tag was also passed through a SwiveLock and I attempted to pass this through the anterior aspect of the greater tuberosity just posterior to the bicipital groove.  However, the luggage tag stitch did not hold and the anchor itself did not achieve appropriate fixation.  Therefore another 4.75 mm SwiveLock anchor was passed.  The repair stitch from this was passed through the graft and I attempted to show this through the anchor but the stitch did not pass through the anchor appropriately.  Therefore it was loaded onto a 3.5 mm push lock anchor and fixed adjacent to the SwiveLock anchor.  This construct achieved appropriate tension and fixation of the dermal allograft.  It covered the entire greater tuberosity.     Fluid was evacuated from the shoulder, and the portals were closed with 3-0 Nylon. Xeroform was applied to the portals. A sterile dressing was applied, followed by a Polar Care sleeve and a SlingShot shoulder immobilizer/sling. The patient was awakened from anesthesia without difficulty and was transferred to the PACU in stable condition.    Of note, assistance from a PA was essential to performing the surgery.  PA was present for the entire surgery.  PA assisted with patient positioning, retraction, instrumentation, and  wound closure. The surgery would have been more difficult and had longer operative time without PA assistance.   COMPLICATIONS: none   DISPOSITION: plan for discharge home after recovery in PACU     POSTOPERATIVE PLAN: Remain in sling (except hygiene and elbow/wrist/hand RoM exercises as instructed by PT) x 4 weeks and NWB for this time. PT to begin 3-4 days after surgery.  Small rotator cuff repair rehab protocol with subscapularis restrictions. ASA 325mg  daily x 2 weeks for DVT ppx.

## 2022-09-17 NOTE — Transfer of Care (Signed)
Immediate Anesthesia Transfer of Care Note  Patient: Andrew Whitehead  Procedure(s) Performed: Right shoulder arthroscopic biceps tenodesis, distal clavicle excision, subacromial decompression, and biologic tuberoplasty with possible partial rotator cuff repair (Right: Shoulder)  Patient Location: PACU  Anesthesia Type:General  Level of Consciousness: awake and sedated  Airway & Oxygen Therapy: Patient Spontanous Breathing  Post-op Assessment: Report given to RN and Post -op Vital signs reviewed and stable  Post vital signs: Reviewed and stable  Last Vitals:  Vitals Value Taken Time  BP 150/75 09/17/22 1343  Temp 97.8 09/17/22 1343   Pulse 89 09/17/22 1345  Resp 28 09/17/22 1345  SpO2 97 % 09/17/22 1345  Vitals shown include unfiled device data.  Last Pain:  Vitals:   09/17/22 0843  TempSrc: Temporal  PainSc: 0-No pain         Complications: No notable events documented.

## 2022-09-18 NOTE — Anesthesia Postprocedure Evaluation (Signed)
Anesthesia Post Note  Patient: Andrew Whitehead  Procedure(s) Performed: Right shoulder arthroscopic biceps tenodesis, distal clavicle excision, subacromial decompression, and biologic tuberoplasty with possible partial rotator cuff repair (Right: Shoulder)  Patient location during evaluation: PACU Anesthesia Type: General Level of consciousness: awake and alert Pain management: pain level controlled Vital Signs Assessment: post-procedure vital signs reviewed and stable Respiratory status: spontaneous breathing, nonlabored ventilation, respiratory function stable and patient connected to nasal cannula oxygen Cardiovascular status: blood pressure returned to baseline and stable Postop Assessment: no apparent nausea or vomiting Anesthetic complications: no   No notable events documented.   Last Vitals:  Vitals:   09/17/22 1426 09/17/22 1437  BP: 117/73 133/74  Pulse: 80 87  Resp: 17 18  Temp: 36.6 C 36.8 C  SpO2: 92% 96%    Last Pain:  Vitals:   09/17/22 1437  TempSrc: Temporal  PainSc: 0-No pain                 Stephanie Coup

## 2022-09-20 ENCOUNTER — Encounter: Payer: Self-pay | Admitting: Orthopedic Surgery

## 2022-09-29 LAB — EXTERNAL GENERIC LAB PROCEDURE

## 2022-09-29 LAB — COLOGUARD

## 2023-01-14 ENCOUNTER — Other Ambulatory Visit: Payer: Self-pay | Admitting: Orthopedic Surgery

## 2023-01-14 DIAGNOSIS — M75121 Complete rotator cuff tear or rupture of right shoulder, not specified as traumatic: Secondary | ICD-10-CM

## 2023-01-28 ENCOUNTER — Encounter: Payer: Self-pay | Admitting: Orthopedic Surgery

## 2023-01-30 ENCOUNTER — Encounter: Payer: Self-pay | Admitting: Orthopedic Surgery

## 2023-02-05 ENCOUNTER — Ambulatory Visit
Admission: RE | Admit: 2023-02-05 | Discharge: 2023-02-05 | Disposition: A | Discharge status: Home / Self Care | Payer: Managed Care, Other (non HMO) | Source: Ambulatory Visit | Attending: Orthopedic Surgery | Admitting: Orthopedic Surgery

## 2023-02-05 DIAGNOSIS — M75121 Complete rotator cuff tear or rupture of right shoulder, not specified as traumatic: Secondary | ICD-10-CM

## 2023-02-20 ENCOUNTER — Other Ambulatory Visit: Payer: Self-pay | Admitting: Orthopedic Surgery

## 2023-03-03 ENCOUNTER — Other Ambulatory Visit: Payer: Self-pay

## 2023-03-03 ENCOUNTER — Encounter
Admission: RE | Admit: 2023-03-03 | Discharge: 2023-03-03 | Disposition: A | Discharge status: Home / Self Care | Payer: Managed Care, Other (non HMO) | Source: Ambulatory Visit | Attending: Orthopedic Surgery | Admitting: Orthopedic Surgery

## 2023-03-03 VITALS — BP 137/65 | HR 68 | Temp 97.9°F | Resp 20 | Ht 69.0 in | Wt 241.0 lb

## 2023-03-03 DIAGNOSIS — Z794 Long term (current) use of insulin: Secondary | ICD-10-CM | POA: Diagnosis not present

## 2023-03-03 DIAGNOSIS — E119 Type 2 diabetes mellitus without complications: Secondary | ICD-10-CM | POA: Insufficient documentation

## 2023-03-03 DIAGNOSIS — Z01812 Encounter for preprocedural laboratory examination: Secondary | ICD-10-CM | POA: Insufficient documentation

## 2023-03-03 DIAGNOSIS — Z01818 Encounter for other preprocedural examination: Secondary | ICD-10-CM | POA: Diagnosis present

## 2023-03-03 HISTORY — DX: Anxiety disorder, unspecified: F41.9

## 2023-03-03 HISTORY — DX: Type 2 diabetes mellitus without complications: E11.9

## 2023-03-03 HISTORY — DX: Sleep apnea, unspecified: G47.30

## 2023-03-03 HISTORY — DX: Elevated blood-pressure reading, without diagnosis of hypertension: R03.0

## 2023-03-03 HISTORY — DX: Unspecified osteoarthritis, unspecified site: M19.90

## 2023-03-03 HISTORY — DX: Atrial premature depolarization: I49.1

## 2023-03-03 HISTORY — DX: Cardiac arrhythmia, unspecified: I49.9

## 2023-03-03 HISTORY — DX: Pain in unspecified shoulder: M25.519

## 2023-03-03 LAB — URINALYSIS, ROUTINE W REFLEX MICROSCOPIC
Bacteria, UA: NONE SEEN
Bilirubin Urine: NEGATIVE
Glucose, UA: >=500 mg/dL — AB
Hgb urine dipstick: NEGATIVE
Ketones, ur: 5 mg/dL — AB
Leukocytes,Ua: NEGATIVE
Nitrite: NEGATIVE
Protein, ur: 100 mg/dL — AB
Specific Gravity, Urine: 1.032 — ABNORMAL HIGH (ref 1.005–1.030)
pH: 5 (ref 5.0–8.0)

## 2023-03-03 LAB — HEMOGLOBIN A1C
Hgb A1c MFr Bld: 8.2 % — ABNORMAL HIGH (ref 4.8–5.6)
Mean Plasma Glucose: 188.64 mg/dL

## 2023-03-03 LAB — SURGICAL PCR SCREEN
MRSA, PCR: NEGATIVE
Staphylococcus aureus: NEGATIVE

## 2023-03-03 NOTE — Patient Instructions (Addendum)
Your procedure is scheduled on: Tuesday 03/11/23 Report to the Registration Desk on the 1st floor of [facility]. To find out your arrival time, please call [phone] between 1PM - 3PM on: Monday 03/10/23 If your arrival time is 6:00 am, do not arrive before that time as the [facility] entrance doors do not open until 6:00 am.  REMEMBER: Instructions that are not followed completely may result in serious medical risk, up to and including death; or upon the discretion of your surgeon and anesthesiologist your surgery may need to be rescheduled.  Do not eat food after midnight the night before surgery.  No gum chewing or hard candies.  You may however, drink CLEAR liquids up to 2 hours before you are scheduled to arrive for your surgery. Do not drink anything within 2 hours of your scheduled arrival time.  Clear liquids include: - water  - apple juice without pulp - gatorade (not RED colors) - black coffee or tea (Do NOT add milk or creamers to the coffee or tea) Do NOT drink anything that is not on this list.  **Type 1 and Type 2 diabetics should only drink water.**  In addition, your doctor has ordered for you to drink the provided:  Ensure Pre-Surgery Clear Carbohydrate Drink  Gatorade G2 Drinking this carbohydrate drink up to two hours before surgery helps to reduce insulin resistance and improve patient outcomes. Please complete drinking 2 hours before scheduled arrival time.  One week prior to surgery: Stop Anti-inflammatories (NSAIDS) such as Advil, Aleve, Ibuprofen, Motrin, Naproxen, Meloxican, Naprosyn and Aspirin based products such as Excedrin, Goody's Powder, BC Powder. Stop ANY OVER THE COUNTER supplements until after surgery.  You may however, continue to take Tylenol if needed for pain up until the day of surgery. Stop JARDIANCE 25 MG 3 days prior to surgery (take last dose Friday 03/07/23) Stop metFORMIN (GLUCOPHAGE) 500 MG 2 days prior to surgery (take last  dose Saturday 03/08/23) Take half of your normal nightly dose of Insulin Glargine (BASAGLAR KWIKPEN) 100 UNIT/ML the night before your surgery.  Continue taking all of your other prescription medications up until the day of surgery.  ON THE DAY OF SURGERY ONLY TAKE THESE MEDICATIONS WITH SIPS OF WATER:  amLODipine (NORVASC) 10 MG  citalopram (CELEXA) 40 MG  busPIRone (BUSPAR) 5 MG  famotidine (PEPCID) 20 MG   Use inhalers on the day of surgery and bring to the hospital.  No Alcohol for 24 hours before or after surgery.  No Smoking including e-cigarettes for 24 hours before surgery.  No chewable tobacco products for at least 6 hours before surgery.  No nicotine patches on the day of surgery.  Do not use any "recreational" drugs for at least a week (preferably 2 weeks) before your surgery.  Please be advised that the combination of cocaine and anesthesia may have negative outcomes, up to and including death. If you test positive for cocaine, your surgery will be cancelled.  On the morning of surgery brush your teeth with toothpaste and water, you may rinse your mouth with mouthwash if you wish. Do not swallow any toothpaste or mouthwash.  Use CHG Soap or wipes as directed on instruction sheet.  Do not wear jewelry, make-up, hairpins, clips or nail polish.  For welded (permanent) jewelry: bracelets, anklets, waist bands, etc.  Please have this removed prior to surgery.  If it is not removed, there is a chance that hospital personnel will need to cut it off on the  day of surgery.  Do not wear lotions, powders, or perfumes.   Do not shave body hair from the neck down 48 hours before surgery.  Contact lenses, hearing aids and dentures may not be worn into surgery.  Do not bring valuables to the hospital. [facility] is not responsible for any missing/lost belongings or valuables.   Total Shoulder Arthroplasty:  use Benzoyl Peroxide 5% Gel as directed on instruction sheet.  Bring  your C-PAP to the hospital in case you may have to spend the night.   Notify your doctor if there is any change in your medical condition (cold, fever, infection).  Wear comfortable clothing (specific to your surgery type) to the hospital.  After surgery, you can help prevent lung complications by doing breathing exercises.  Take deep breaths and cough every 1-2 hours. Your doctor may order a device called an Incentive Spirometer to help you take deep breaths. When coughing or sneezing, hold a pillow firmly against your incision with both hands. This is called "splinting." Doing this helps protect your incision. It also decreases belly discomfort.  If you are being admitted to the hospital overnight, leave your suitcase in the car. After surgery it may be brought to your room.  In case of increased patient census, it may be necessary for you, the patient, to continue your postoperative care in the Same Day Surgery department.  If you are being discharged the day of surgery, you will not be allowed to drive home. You will need a responsible individual to drive you home and stay with you for 24 hours after surgery.   If you are taking public transportation, you will need to have a responsible individual with you.  Please call the Pre-admissions Testing Dept. at [phone] if you have any questions about these instructions.  Surgery Visitation Policy:  Patients having surgery or a procedure may have two visitors.  Children under the age of 70 must have an adult with them who is not the patient.  Temporary Visitor Restrictions Due to increasing cases of flu, RSV and COVID-19: Children ages 20 and under will not be able to visit patients in [facility] hospitals under most circumstances.  Inpatient Visitation:    Visiting hours are 7 a.m. to 8 p.m. Up to four visitors are allowed at one time in a patient room. The visitors may rotate out with other people during the day.  One visitor  age 65 or older may stay with the patient overnight and must be in the room by 8 p.m.  Pre-operative 5 CHG Bath Instructions   You can play a key role in reducing the risk of infection after surgery. Your skin needs to be as free of germs as possible. You can reduce the number of germs on your skin by washing with CHG (chlorhexidine gluconate) soap before surgery. CHG is an antiseptic soap that kills germs and continues to kill germs even after washing.   DO NOT use if you have an allergy to chlorhexidine/CHG or antibacterial soaps. If your skin becomes reddened or irritated, stop using the CHG and notify one of our RNs at [phone].   Please shower with the CHG soap starting 4 days before surgery using the following schedule:     Please keep in mind the following:  DO NOT shave, including legs and underarms, starting the day of your first shower.   You may shave your face at any point before/day of surgery.  Place clean sheets on your  bed the day you start using CHG soap. Use a clean washcloth (not used since being washed) for each shower. DO NOT sleep with pets once you start using the CHG.   CHG Shower Instructions:  If you choose to wash your hair and private area, wash first with your normal shampoo/soap.  After you use shampoo/soap, rinse your hair and body thoroughly to remove shampoo/soap residue.  Turn the water OFF and apply about 3 tablespoons (45 ml) of CHG soap to a CLEAN washcloth.  Apply CHG soap ONLY FROM YOUR NECK DOWN TO YOUR TOES (washing for 3-5 minutes)  DO NOT use CHG soap on face, private areas, open wounds, or sores.  Pay special attention to the area where your surgery is being performed.  If you are having back surgery, having someone wash your back for you may be helpful. Wait 2 minutes after CHG soap is applied, then you may rinse off the CHG soap.  Pat dry with a clean towel  Put on clean clothes/pajamas   If you choose to wear lotion, please use ONLY the  CHG-compatible lotions on the back of this paper.     Additional instructions for the day of surgery: DO NOT APPLY any lotions, deodorants, cologne, or perfumes.   Put on clean/comfortable clothes.  Brush your teeth.  Ask your nurse before applying any prescription medications to the skin.      CHG Compatible Lotions   Aveeno Moisturizing lotion  Cetaphil Moisturizing Cream  Cetaphil Moisturizing Lotion  Clairol Herbal Essence Moisturizing Lotion, Dry Skin  Clairol Herbal Essence Moisturizing Lotion, Extra Dry Skin  Clairol Herbal Essence Moisturizing Lotion, Normal Skin  Curel Age Defying Therapeutic Moisturizing Lotion with Alpha Hydroxy  Curel Extreme Care Body Lotion  Curel Soothing Hands Moisturizing Hand Lotion  Curel Therapeutic Moisturizing Cream, Fragrance-Free  Curel Therapeutic Moisturizing Lotion, Fragrance-Free  Curel Therapeutic Moisturizing Lotion, Original Formula  Eucerin Daily Replenishing Lotion  Eucerin Dry Skin Therapy Plus Alpha Hydroxy Crme  Eucerin Dry Skin Therapy Plus Alpha Hydroxy Lotion  Eucerin Original Crme  Eucerin Original Lotion  Eucerin Plus Crme Eucerin Plus Lotion  Eucerin TriLipid Replenishing Lotion  Keri Anti-Bacterial Hand Lotion  Keri Deep Conditioning Original Lotion Dry Skin Formula Softly Scented  Keri Deep Conditioning Original Lotion, Fragrance Free Sensitive Skin Formula  Keri Lotion Fast Absorbing Fragrance Free Sensitive Skin Formula  Keri Lotion Fast Absorbing Softly Scented Dry Skin Formula  Keri Original Lotion  Keri Skin Renewal Lotion Keri Silky Smooth Lotion  Keri Silky Smooth Sensitive Skin Lotion  Nivea Body Creamy Conditioning Oil  Nivea Body Extra Enriched Lotion  Nivea Body Original Lotion  Nivea Body Sheer Moisturizing Lotion Nivea Crme  Nivea Skin Firming Lotion  NutraDerm 30 Skin Lotion  NutraDerm Skin Lotion  NutraDerm Therapeutic Skin Cream  NutraDerm Therapeutic Skin Lotion  ProShield Protective  Hand Cream  Provon moisturizing lotion   Preparing for Total Shoulder Arthroplasty  Before surgery, you can play an important role by reducing the number of germs on your skin by using the following products:  Benzoyl Peroxide Gel  o Reduces the number of germs present on the skin  o Applied twice a day to shoulder area starting two days before surgery  Chlorhexidine Gluconate (CHG) Soap  o An antiseptic cleaner that kills germs and bonds with the skin to continue killing germs even after washing  o Used for showering the night before surgery and morning of surgery  BENZOYL PEROXIDE 5% GEL  Please do  not use if you have an allergy to benzoyl peroxide. If your skin becomes reddened/irritated stop using the benzoyl peroxide.  Starting two days before surgery, apply as follows:  1. Apply benzoyl peroxide in the morning and at night. Apply after taking a shower. If you are not taking a shower, clean entire shoulder front, back, and side along with the armpit with a clean wet washcloth.  2. Place a quarter-sized dollop on your shoulder and rub in thoroughly, making sure to cover the front, back, and side of your shoulder, along with the armpit.  2 days before ____ AM ____ PM 1 day before ____ AM ____ PM  3. Do this twice a day for two days. (Last application is the night before surgery, AFTER using the CHG soap).  4. Do NOT apply benzoyl peroxide gel on the day of surgery.  How to Use an Incentive Spirometer  An incentive spirometer is a tool that measures how well you are filling your lungs with each breath. Learning to take long, deep breaths using this tool can help you keep your lungs clear and active. This may help to reverse or lessen your chance of developing breathing (pulmonary) problems, especially infection. You may be asked to use a spirometer: After a surgery. If you have a lung problem or a history of smoking. After a long period of time when you have been unable to  move or be active. If the spirometer includes an indicator to show the highest number that you have reached, your health care provider or respiratory therapist will help you set a goal. Keep a log of your progress as told by your health care provider. What are the risks? Breathing too quickly may cause dizziness or cause you to pass out. Take your time so you do not get dizzy or light-headed. If you are in pain, you may need to take pain medicine before doing incentive spirometry. It is harder to take a deep breath if you are having pain. How to use your incentive spirometer  Sit up on the edge of your bed or on a chair. Hold the incentive spirometer so that it is in an upright position. Before you use the spirometer, breathe out normally. Place the mouthpiece in your mouth. Make sure your lips are closed tightly around it. Breathe in slowly and as deeply as you can through your mouth, causing the piston or the ball to rise toward the top of the chamber. Hold your breath for 3-5 seconds, or for as long as possible. If the spirometer includes a coach indicator, use this to guide you in breathing. Slow down your breathing if the indicator goes above the marked areas. Remove the mouthpiece from your mouth and breathe out normally. The piston or ball will return to the bottom of the chamber. Rest for a few seconds, then repeat the steps 10 or more times. Take your time and take a few normal breaths between deep breaths so that you do not get dizzy or light-headed. Do this every 1-2 hours when you are awake. If the spirometer includes a goal marker to show the highest number you have reached (best effort), use this as a goal to work toward during each repetition. After each set of 10 deep breaths, cough a few times. This will help to make sure that your lungs are clear. If you have an incision on your chest or abdomen from surgery, place a pillow or a rolled-up towel firmly against the incision when  you cough. This can help to reduce pain while taking deep breaths and coughing. General tips When you are able to get out of bed: Walk around often. Continue to take deep breaths and cough in order to clear your lungs. Keep using the incentive spirometer until your health care provider says it is okay to stop using it. If you have been in the hospital, you may be told to keep using the spirometer at home. Contact a health care provider if: You are having difficulty using the spirometer. You have trouble using the spirometer as often as instructed. Your pain medicine is not giving enough relief for you to use the spirometer as told. You have a fever. Get help right away if: You develop shortness of breath. You develop a cough with bloody mucus from the lungs. You have fluid or blood coming from an incision site after you cough. Summary An incentive spirometer is a tool that can help you learn to take long, deep breaths to keep your lungs clear and active. You may be asked to use a spirometer after a surgery, if you have a lung problem or a history of smoking, or if you have been inactive for a long period of time. Use your incentive spirometer as instructed every 1-2 hours while you are awake. If you have an incision on your chest or abdomen, place a pillow or a rolled-up towel firmly against your incision when you cough. This will help to reduce pain. Get help right away if you have shortness of breath, you cough up bloody mucus, or blood comes from your incision when you cough. This information is not intended to replace advice given to you by your health care provider. Make sure you discuss any questions you have with your health care provider. Document Revised: 04/05/2019 Document Reviewed: 04/05/2019 Elsevier Patient Education  2023 [employer].  Please go to the following website to access important education materials concerning your upcoming joint replacement.                                    [email or web address]

## 2023-03-11 ENCOUNTER — Encounter: Payer: Self-pay | Admitting: Orthopedic Surgery

## 2023-03-11 ENCOUNTER — Encounter
Admission: RE | Disposition: A | Discharge status: Home / Self Care | Payer: Self-pay | Source: Home / Self Care | Attending: Orthopedic Surgery

## 2023-03-11 ENCOUNTER — Ambulatory Visit: Payer: Managed Care, Other (non HMO)

## 2023-03-11 ENCOUNTER — Other Ambulatory Visit: Payer: Self-pay

## 2023-03-11 ENCOUNTER — Ambulatory Visit
Admission: RE | Admit: 2023-03-11 | Discharge: 2023-03-11 | Disposition: A | Discharge status: Home / Self Care | Payer: Managed Care, Other (non HMO) | Attending: Orthopedic Surgery | Admitting: Orthopedic Surgery

## 2023-03-11 ENCOUNTER — Ambulatory Visit: Payer: Managed Care, Other (non HMO) | Admitting: Urgent Care

## 2023-03-11 ENCOUNTER — Ambulatory Visit: Payer: Self-pay | Admitting: Urgent Care

## 2023-03-11 DIAGNOSIS — Z791 Long term (current) use of non-steroidal anti-inflammatories (NSAID): Secondary | ICD-10-CM | POA: Insufficient documentation

## 2023-03-11 DIAGNOSIS — Z794 Long term (current) use of insulin: Secondary | ICD-10-CM | POA: Insufficient documentation

## 2023-03-11 DIAGNOSIS — K219 Gastro-esophageal reflux disease without esophagitis: Secondary | ICD-10-CM | POA: Diagnosis not present

## 2023-03-11 DIAGNOSIS — F17210 Nicotine dependence, cigarettes, uncomplicated: Secondary | ICD-10-CM | POA: Diagnosis not present

## 2023-03-11 DIAGNOSIS — E119 Type 2 diabetes mellitus without complications: Secondary | ICD-10-CM | POA: Insufficient documentation

## 2023-03-11 DIAGNOSIS — F32A Depression, unspecified: Secondary | ICD-10-CM | POA: Diagnosis not present

## 2023-03-11 DIAGNOSIS — Z7984 Long term (current) use of oral hypoglycemic drugs: Secondary | ICD-10-CM | POA: Diagnosis not present

## 2023-03-11 DIAGNOSIS — I10 Essential (primary) hypertension: Secondary | ICD-10-CM | POA: Diagnosis not present

## 2023-03-11 DIAGNOSIS — M75121 Complete rotator cuff tear or rupture of right shoulder, not specified as traumatic: Secondary | ICD-10-CM | POA: Insufficient documentation

## 2023-03-11 HISTORY — PX: BICEPT TENODESIS: SHX5116

## 2023-03-11 HISTORY — PX: REVERSE SHOULDER ARTHROPLASTY: SHX5054

## 2023-03-11 LAB — GLUCOSE, CAPILLARY
Glucose-Capillary: 144 mg/dL — ABNORMAL HIGH (ref 70–99)
Glucose-Capillary: 183 mg/dL — ABNORMAL HIGH (ref 70–99)

## 2023-03-11 SURGERY — ARTHROPLASTY, SHOULDER, TOTAL, REVERSE
Anesthesia: General | Site: Shoulder | Laterality: Right

## 2023-03-11 MED ORDER — MIDAZOLAM HCL 2 MG/2ML IJ SOLN
INTRAMUSCULAR | Status: AC
  Filled 2023-03-11: qty 2

## 2023-03-11 MED ORDER — LIDOCAINE HCL (PF) 1 % IJ SOLN
INTRAMUSCULAR | Status: AC
  Filled 2023-03-11: qty 5

## 2023-03-11 MED ORDER — CEFAZOLIN SODIUM-DEXTROSE 2-4 GM/100ML-% IV SOLN
INTRAVENOUS | Status: AC
  Filled 2023-03-11: qty 100

## 2023-03-11 MED ORDER — PHENYLEPHRINE HCL-NACL 20-0.9 MG/250ML-% IV SOLN
INTRAVENOUS | Status: DC | PRN
  Administered 2023-03-11: 30 ug/min via INTRAVENOUS

## 2023-03-11 MED ORDER — VANCOMYCIN HCL 1000 MG IV SOLR
INTRAVENOUS | Status: DC | PRN
  Administered 2023-03-11: 1000 mg via TOPICAL

## 2023-03-11 MED ORDER — IRRISEPT - 450ML BOTTLE WITH 0.05% CHG IN STERILE WATER, USP 99.95% OPTIME
TOPICAL | Status: DC | PRN
  Administered 2023-03-11: 450 mL via TOPICAL

## 2023-03-11 MED ORDER — TRANEXAMIC ACID-NACL 1000-0.7 MG/100ML-% IV SOLN
INTRAVENOUS | Status: AC
  Filled 2023-03-11: qty 100

## 2023-03-11 MED ORDER — ALBUTEROL SULFATE HFA 108 (90 BASE) MCG/ACT IN AERS
INHALATION_SPRAY | RESPIRATORY_TRACT | Status: DC | PRN
  Administered 2023-03-11: 6 via RESPIRATORY_TRACT

## 2023-03-11 MED ORDER — FENTANYL CITRATE (PF) 100 MCG/2ML IJ SOLN
INTRAMUSCULAR | Status: AC
  Filled 2023-03-11: qty 2

## 2023-03-11 MED ORDER — EPHEDRINE 5 MG/ML INJ
INTRAVENOUS | Status: AC
  Filled 2023-03-11: qty 5

## 2023-03-11 MED ORDER — PHENYLEPHRINE HCL-NACL 20-0.9 MG/250ML-% IV SOLN
INTRAVENOUS | Status: AC
  Filled 2023-03-11: qty 250

## 2023-03-11 MED ORDER — ATROPINE SULFATE 0.4 MG/ML IV SOLN
INTRAVENOUS | Status: AC
  Filled 2023-03-11: qty 1

## 2023-03-11 MED ORDER — HYDROMORPHONE HCL 1 MG/ML IJ SOLN: 0.5000 mg | INTRAMUSCULAR | Status: DC | PRN

## 2023-03-11 MED ORDER — CEFAZOLIN SODIUM-DEXTROSE 2-4 GM/100ML-% IV SOLN
2.0000 g | INTRAVENOUS | Status: AC
Start: 2023-03-11 — End: 2023-03-11
  Administered 2023-03-11: 2 g via INTRAVENOUS

## 2023-03-11 MED ORDER — SODIUM CHLORIDE 0.9 % IR SOLN
Status: DC | PRN
  Administered 2023-03-11: 3000 mL

## 2023-03-11 MED ORDER — SUCCINYLCHOLINE CHLORIDE 200 MG/10ML IV SOSY
PREFILLED_SYRINGE | INTRAVENOUS | Status: DC | PRN
  Administered 2023-03-11: 100 mg via INTRAVENOUS

## 2023-03-11 MED ORDER — SUGAMMADEX SODIUM 200 MG/2ML IV SOLN
INTRAVENOUS | Status: DC | PRN
  Administered 2023-03-11: 250 mg via INTRAVENOUS
  Administered 2023-03-11: 100 mg via INTRAVENOUS

## 2023-03-11 MED ORDER — FENTANYL CITRATE (PF) 100 MCG/2ML IJ SOLN
INTRAMUSCULAR | Status: DC | PRN
  Administered 2023-03-11 (×2): 50 ug via INTRAVENOUS

## 2023-03-11 MED ORDER — FENTANYL CITRATE PF 50 MCG/ML IJ SOSY
PREFILLED_SYRINGE | INTRAMUSCULAR | Status: AC
  Filled 2023-03-11: qty 1

## 2023-03-11 MED ORDER — TRANEXAMIC ACID-NACL 1000-0.7 MG/100ML-% IV SOLN
1000.0000 mg | INTRAVENOUS | Status: AC
  Administered 2023-03-11: 1000 mg via INTRAVENOUS

## 2023-03-11 MED ORDER — EPHEDRINE SULFATE-NACL 50-0.9 MG/10ML-% IV SOSY
PREFILLED_SYRINGE | INTRAVENOUS | Status: DC | PRN
  Administered 2023-03-11: 15 mg via INTRAVENOUS

## 2023-03-11 MED ORDER — BUPIVACAINE HCL (PF) 0.5 % IJ SOLN
INTRAMUSCULAR | Status: AC
  Filled 2023-03-11: qty 20

## 2023-03-11 MED ORDER — ROCURONIUM BROMIDE 100 MG/10ML IV SOLN
INTRAVENOUS | Status: DC | PRN
  Administered 2023-03-11 (×2): 40 mg via INTRAVENOUS

## 2023-03-11 MED ORDER — CHLORHEXIDINE GLUCONATE 0.12 % MT SOLN
OROMUCOSAL | Status: AC
  Filled 2023-03-11: qty 15

## 2023-03-11 MED ORDER — EPHEDRINE SULFATE (PRESSORS) 50 MG/ML IJ SOLN
INTRAMUSCULAR | Status: DC | PRN
  Administered 2023-03-11: 5 mg via INTRAVENOUS
  Administered 2023-03-11: 10 mg via INTRAVENOUS

## 2023-03-11 MED ORDER — PROPOFOL 10 MG/ML IV BOLUS
INTRAVENOUS | Status: AC
  Filled 2023-03-11: qty 20

## 2023-03-11 MED ORDER — PHENYLEPHRINE 80 MCG/ML (10ML) SYRINGE FOR IV PUSH (FOR BLOOD PRESSURE SUPPORT)
PREFILLED_SYRINGE | INTRAVENOUS | Status: DC | PRN
  Administered 2023-03-11: 200 ug via INTRAVENOUS

## 2023-03-11 MED ORDER — GLYCOPYRROLATE 0.2 MG/ML IJ SOLN
INTRAMUSCULAR | Status: DC | PRN
  Administered 2023-03-11: .2 mg via INTRAVENOUS

## 2023-03-11 MED ORDER — FENTANYL CITRATE (PF) 100 MCG/2ML IJ SOLN: 25.0000 ug | INTRAMUSCULAR | Status: DC | PRN

## 2023-03-11 MED ORDER — VANCOMYCIN HCL 1000 MG IV SOLR
INTRAVENOUS | Status: AC
  Filled 2023-03-11: qty 20

## 2023-03-11 MED ORDER — OXYCODONE HCL 5 MG PO TABS
5.0000 mg | ORAL_TABLET | Freq: Once | ORAL | Status: AC | PRN
  Administered 2023-03-11: 5 mg via ORAL

## 2023-03-11 MED ORDER — ATROPINE SULFATE 0.4 MG/ML IV SOLN
INTRAVENOUS | Status: DC | PRN
  Administered 2023-03-11: .2 mg via INTRAVENOUS

## 2023-03-11 MED ORDER — 0.9 % SODIUM CHLORIDE (POUR BTL) OPTIME
TOPICAL | Status: DC | PRN
  Administered 2023-03-11: 500 mL

## 2023-03-11 MED ORDER — ONDANSETRON HCL 4 MG/2ML IJ SOLN
INTRAMUSCULAR | Status: DC | PRN
  Administered 2023-03-11: 4 mg via INTRAVENOUS

## 2023-03-11 MED ORDER — OXYCODONE HCL 5 MG/5ML PO SOLN: 5.0000 mg | Freq: Once | ORAL | Status: AC | PRN

## 2023-03-11 MED ORDER — MIDAZOLAM HCL 2 MG/2ML IJ SOLN
1.0000 mg | INTRAMUSCULAR | Status: DC | PRN
  Administered 2023-03-11: 1 mg via INTRAVENOUS

## 2023-03-11 MED ORDER — OXYCODONE HCL 5 MG PO TABS
ORAL_TABLET | ORAL | Status: AC
  Filled 2023-03-11: qty 1

## 2023-03-11 MED ORDER — PROPOFOL 10 MG/ML IV BOLUS
INTRAVENOUS | Status: DC | PRN
Start: 2023-03-11 — End: 2023-03-11
  Administered 2023-03-11 (×2): 50 mg via INTRAVENOUS

## 2023-03-11 MED ORDER — SUCCINYLCHOLINE CHLORIDE 200 MG/10ML IV SOSY
PREFILLED_SYRINGE | INTRAVENOUS | Status: AC
  Filled 2023-03-11: qty 10

## 2023-03-11 MED ORDER — LIDOCAINE HCL (PF) 2 % IJ SOLN
INTRAMUSCULAR | Status: AC
Start: 2023-03-11 — End: ?
  Filled 2023-03-11: qty 5

## 2023-03-11 MED ORDER — PHENYLEPHRINE 80 MCG/ML (10ML) SYRINGE FOR IV PUSH (FOR BLOOD PRESSURE SUPPORT)
PREFILLED_SYRINGE | INTRAVENOUS | Status: AC
  Filled 2023-03-11: qty 10

## 2023-03-11 MED ORDER — SODIUM CHLORIDE 0.9 % IV SOLN: INTRAVENOUS | Status: DC

## 2023-03-11 MED ORDER — CHLORHEXIDINE GLUCONATE 0.12 % MT SOLN
15.0000 mL | Freq: Once | OROMUCOSAL | Status: AC
  Administered 2023-03-11: 15 mL via OROMUCOSAL

## 2023-03-11 MED ORDER — CEFAZOLIN SODIUM-DEXTROSE 2-4 GM/100ML-% IV SOLN
2.0000 g | Freq: Once | INTRAVENOUS | Status: AC
  Administered 2023-03-11: 2 g via INTRAVENOUS

## 2023-03-11 MED ORDER — BUPIVACAINE HCL (PF) 0.5 % IJ SOLN
INTRAMUSCULAR | Status: DC | PRN
  Administered 2023-03-11: 20 mL

## 2023-03-11 MED ORDER — ASPIRIN 325 MG PO TBEC
325.0000 mg | DELAYED_RELEASE_TABLET | Freq: Every day | ORAL | 0 refills | Status: AC
Start: 2023-03-11 — End: 2023-04-22

## 2023-03-11 MED ORDER — LACTATED RINGERS IV SOLN: INTRAVENOUS | Status: DC | PRN

## 2023-03-11 MED ORDER — FENTANYL CITRATE PF 50 MCG/ML IJ SOSY
50.0000 ug | PREFILLED_SYRINGE | Freq: Once | INTRAMUSCULAR | Status: AC
  Administered 2023-03-11: 50 ug via INTRAVENOUS

## 2023-03-11 MED ORDER — ORAL CARE MOUTH RINSE: 15.0000 mL | Freq: Once | OROMUCOSAL | Status: AC

## 2023-03-11 MED ORDER — TRANEXAMIC ACID-NACL 1000-0.7 MG/100ML-% IV SOLN
1000.0000 mg | INTRAVENOUS | Status: AC
Start: 2023-03-11 — End: 2023-03-11
  Administered 2023-03-11: 1000 mg via INTRAVENOUS

## 2023-03-11 MED ORDER — ONDANSETRON 4 MG PO TBDP
4.0000 mg | ORAL_TABLET | Freq: Three times a day (TID) | ORAL | 0 refills | Status: AC | PRN
Start: 2023-03-11 — End: ?

## 2023-03-11 MED ORDER — GLYCOPYRROLATE 0.2 MG/ML IJ SOLN
INTRAMUSCULAR | Status: AC
  Filled 2023-03-11: qty 1

## 2023-03-11 MED ORDER — ACETAMINOPHEN 500 MG PO TABS: 1000.0000 mg | ORAL_TABLET | Freq: Three times a day (TID) | ORAL | 2 refills | Status: AC

## 2023-03-11 MED ORDER — DEXAMETHASONE SODIUM PHOSPHATE 10 MG/ML IJ SOLN
INTRAMUSCULAR | Status: AC
  Filled 2023-03-11: qty 1

## 2023-03-11 MED ORDER — OXYCODONE HCL 5 MG PO TABS: 5.0000 mg | ORAL_TABLET | ORAL | 0 refills | Status: AC | PRN

## 2023-03-11 MED ORDER — ONDANSETRON HCL 4 MG/2ML IJ SOLN
INTRAMUSCULAR | Status: AC
  Filled 2023-03-11: qty 2

## 2023-03-11 MED ORDER — DEXAMETHASONE SODIUM PHOSPHATE 10 MG/ML IJ SOLN
INTRAMUSCULAR | Status: DC | PRN
  Administered 2023-03-11: 10 mg via INTRAVENOUS

## 2023-03-11 SURGICAL SUPPLY — 79 items
BASEPLATE GLEN ALTIVATE 6.5 (Joint) IMPLANT
BIT DRILL 12.7X2STRG SHNK (BIT) IMPLANT
BIT DRL 12.7X2STRG SHNK (BIT) ×1 IMPLANT
BLADE SAGITTAL WIDE XTHICK NO (BLADE) ×1 IMPLANT
CHLORAPREP W/TINT 26 (MISCELLANEOUS) ×1 IMPLANT
CNTNR URN SCR LID CUP LEK RST (MISCELLANEOUS) IMPLANT
COOLER POLAR GLACIER W/PUMP (MISCELLANEOUS) ×1 IMPLANT
DERMABOND ADVANCED .7 DNX12 (GAUZE/BANDAGES/DRESSINGS) IMPLANT
DRAPE INCISE IOBAN 66X45 STRL (DRAPES) ×2 IMPLANT
DRAPE SHEET LG 3/4 BI-LAMINATE (DRAPES) ×2 IMPLANT
DRAPE TABLE BACK 80X90 (DRAPES) ×1 IMPLANT
DRAPE U-SHAPE 47X51 STRL (DRAPES) ×1 IMPLANT
DRILL GLEN ALTIVATE 3.5 (DRILL) IMPLANT
DRSG OPSITE POSTOP 3X4 (GAUZE/BANDAGES/DRESSINGS) IMPLANT
DRSG OPSITE POSTOP 4X6 (GAUZE/BANDAGES/DRESSINGS) IMPLANT
DRSG OPSITE POSTOP 4X8 (GAUZE/BANDAGES/DRESSINGS) IMPLANT
DRSG TEGADERM 2-3/8X2-3/4 SM (GAUZE/BANDAGES/DRESSINGS) IMPLANT
ELECT REM PT RETURN 9FT ADLT (ELECTROSURGICAL) ×1 IMPLANT
ELECTRODE REM PT RTRN 9FT ADLT (ELECTROSURGICAL) ×1 IMPLANT
EVACUATOR 1/8 PVC DRAIN (DRAIN) IMPLANT
GAUZE SPONGE 2X2 STRL 8-PLY (GAUZE/BANDAGES/DRESSINGS) IMPLANT
GAUZE XEROFORM 1X8 LF (GAUZE/BANDAGES/DRESSINGS) IMPLANT
GLOVE BIOGEL PI IND STRL 8 (GLOVE) ×2 IMPLANT
GLOVE PI ULTRA LF STRL 7.5 (GLOVE) ×2 IMPLANT
GLOVE SURG ORTHO 8.0 STRL STRW (GLOVE) ×2 IMPLANT
GLOVE SURG SYN 8.0 (GLOVE) ×1 IMPLANT
GLOVE SURG SYN 8.0 PF PI (GLOVE) ×1 IMPLANT
GOWN STRL REUS W/ TWL LRG LVL3 (GOWN DISPOSABLE) ×2 IMPLANT
GOWN STRL REUS W/ TWL XL LVL3 (GOWN DISPOSABLE) ×1 IMPLANT
GUIDE BONE MODEL RSA DJO (ORTHOPEDIC DISPOSABLE SUPPLIES) IMPLANT
GUIDE WIRE ALTIVATE 2.4X228 SL (WIRE) IMPLANT
HEAD GLENOID W/SCREW 32MM (Shoulder) IMPLANT
HOOD PEEL AWAY T7 (MISCELLANEOUS) ×2 IMPLANT
INSERT EPOLY STND HUMERUS 36MM (Shoulder) ×1 IMPLANT
INSERT EPOLYSTD HUMERUS 36MM (Shoulder) IMPLANT
JET LAVAGE IRRISEPT WOUND (IRRIGATION / IRRIGATOR) ×1 IMPLANT
KIT STABILIZATION SHOULDER (MISCELLANEOUS) ×1 IMPLANT
LAVAGE JET IRRISEPT WOUND (IRRIGATION / IRRIGATOR) IMPLANT
MANIFOLD NEPTUNE II (INSTRUMENTS) ×1 IMPLANT
MASK FACE SPIDER DISP (MASK) ×1 IMPLANT
MAT ABSORB FLUID 56X50 GRAY (MISCELLANEOUS) ×1 IMPLANT
NDL REVERSE CUT 1/2 CRC (NEEDLE) IMPLANT
NDL SPNL 20GX3.5 QUINCKE YW (NEEDLE) IMPLANT
NEEDLE REVERSE CUT 1/2 CRC (NEEDLE) ×1 IMPLANT
NEEDLE SPNL 20GX3.5 QUINCKE YW (NEEDLE) IMPLANT
NS IRRIG 1000ML POUR BTL (IV SOLUTION) ×1 IMPLANT
NS IRRIG 500ML POUR BTL (IV SOLUTION) IMPLANT
PACK ARTHROSCOPY SHOULDER (MISCELLANEOUS) ×1 IMPLANT
PAD WRAPON POLAR SHDR XLG (MISCELLANEOUS) ×1 IMPLANT
PULSAVAC PLUS IRRIG FAN TIP (DISPOSABLE) ×1 IMPLANT
SCREW CENTR ALTIVATE 6.5X30 (Screw) IMPLANT
SCREW PERI ALTIVATE REV 14 (Screw) IMPLANT
SCREW PERI ALTIVATE REV 30 (Screw) IMPLANT
SCREW PERI ALTIVATE REV 38 (Screw) IMPLANT
SLING ULTRA II LG (MISCELLANEOUS) IMPLANT
SLING ULTRA II M (MISCELLANEOUS) IMPLANT
SPONGE T-LAP 18X18 ~~LOC~~+RFID (SPONGE) ×1 IMPLANT
STAPLER SKIN PROX 35W (STAPLE) IMPLANT
STEM HUMERAL 12X48 STD SHORT (Shoulder) IMPLANT
STRAP SAFETY 5IN WIDE (MISCELLANEOUS) ×1 IMPLANT
SUT ETHIBOND 5-0 MS/4 CCS GRN (SUTURE) ×1 IMPLANT
SUT FIBERWIRE #2 38 BLUE 1/2 (SUTURE) ×4 IMPLANT
SUT MNCRL AB 4-0 PS2 18 (SUTURE) IMPLANT
SUT PROLENE 6 0 P 1 18 (SUTURE) IMPLANT
SUT TICRON 2-0 30IN 311381 (SUTURE) ×2 IMPLANT
SUT VIC AB 0 CT1 36 (SUTURE) ×1 IMPLANT
SUT VIC AB 2-0 CT2 27 (SUTURE) ×2 IMPLANT
SUT XBRAID 1.4 BLK/WHT (SUTURE) IMPLANT
SUT XBRAID 1.4 BLUE (SUTURE) IMPLANT
SUT XBRAID 1.4 WHITE/BLUE (SUTURE) IMPLANT
SUT XBRAID 2 BLACK/BLUE (SUTURE) IMPLANT
SUTURE ETHBND 5-0 MS/4 CCS GRN (SUTURE) ×1 IMPLANT
SUTURE FIBERWR #2 38 BLUE 1/2 (SUTURE) ×1 IMPLANT
SYR 30ML LL (SYRINGE) IMPLANT
TAP CANN GLEN 6.5 (TAP) IMPLANT
TIP FAN IRRIG PULSAVAC PLUS (DISPOSABLE) ×1 IMPLANT
TRAP FLUID SMOKE EVACUATOR (MISCELLANEOUS) ×1 IMPLANT
WATER STERILE IRR 500ML POUR (IV SOLUTION) ×1 IMPLANT
WRAPON POLAR PAD SHDR XLG (MISCELLANEOUS) ×1 IMPLANT

## 2023-03-11 NOTE — Evaluation (Signed)
Physical Therapy Evaluation Patient Details Name: Andrew Whitehead MRN: [id number] DOB: [birth year] Today's Date: 03/11/2023  History of Present Illness  Pt admitted for R reverse shoulder surgery with biceps tenodesis. Pt is POD 0 at time of evaluation.  Clinical Impression  Pt is a pleasant 58 year old male who was admitted for R reverse shoulder surgery. Pt performs transfers with mod I and ambulation with supervision and no AD. Pt demonstrates deficits with mobility. Plan for dc from hospital this date and will resume PT post op when MD clears. No further PT needs at this time.       If plan is discharge home, recommend the following: A little help with walking and/or transfers;A little help with bathing/dressing/bathroom   Can travel by private vehicle        Equipment Recommendations None recommended by PT  Recommendations for Other Services       Functional Status Assessment Patient has had a recent decline in their functional status and demonstrates the ability to make significant improvements in function in a reasonable and predictable amount of time.     Precautions / Restrictions Precautions Precautions: Fall Restrictions Weight Bearing Restrictions Per Provider Order: No      Mobility  Bed Mobility               General bed mobility comments: NT, received in recliner    Transfers Overall transfer level: Modified independent Equipment used: None               General transfer comment: safe technique with upright posture noted    Ambulation/Gait Ambulation/Gait assistance: Supervision Gait Distance (Feet): 100 Feet Assistive device: None Gait Pattern/deviations: Step-through pattern       General Gait Details: ambulated in hallway with safe technique and no AD  Stairs Stairs: Yes Stairs assistance: Contact guard assist Stair Management: One rail Left Number of Stairs: 4 General stair comments: pt reaching out for railing. Unable to  perform without UE support. Education for fiance to help with stairs at home  Wheelchair Mobility     Tilt Bed    Modified Rankin (Stroke Patients Only)       Balance Overall balance assessment: Modified Independent                                           Pertinent Vitals/Pain Pain Assessment Pain Assessment: No/denies pain    Home Living Family/patient expects to be discharged to:: Private residence Living Arrangements: Spouse/significant other;Other relatives Available Help at Discharge: Family Type of Home: House Home Access: Stairs to enter Entrance Stairs-Rails: None Entrance Stairs-Number of Steps: 2   Home Layout: One level Home Equipment: None      Prior Function Prior Level of Function : Independent/Modified Independent             Mobility Comments: reports he has not had any falls and is currently not working at this time ADLs Comments: indep     Extremity/Trunk Assessment   Upper Extremity Assessment Upper Extremity Assessment:  (R grip 3+/5; L UE WNL)    Lower Extremity Assessment Lower Extremity Assessment: Overall WFL for tasks assessed       Communication   Communication Communication: No apparent difficulties    Cognition Arousal: Alert Behavior During Therapy: WFL for tasks assessed/performed   PT - Cognitive impairments: No apparent impairments  PT - Cognition Comments: flat affect Following commands: Intact       Cueing       General Comments      Exercises Other Exercises Other Exercises: reviewed polar care machine and hand/wrist ther-ex   Assessment/Plan    PT Assessment All further PT needs can be met in the next venue of care  PT Problem List Decreased strength;Decreased balance;Decreased mobility;Pain       PT Treatment Interventions      PT Goals (Current goals can be found in the Care Plan section)  Acute Rehab PT Goals Patient Stated Goal: to go  home PT Goal Formulation: All assessment and education complete, DC therapy Time For Goal Achievement: 03/11/23 Potential to Achieve Goals: Good    Frequency       Co-evaluation               AM-PAC PT "6 Clicks" Mobility  Outcome Measure Help needed turning from your back to your side while in a flat bed without using bedrails?: None Help needed moving from lying on your back to sitting on the side of a flat bed without using bedrails?: None Help needed moving to and from a bed to a chair (including a wheelchair)?: None Help needed standing up from a chair using your arms (e.g., wheelchair or bedside chair)?: None Help needed to walk in hospital room?: A Little Help needed climbing 3-5 steps with a railing? : A Little 6 Click Score: 22    End of Session   Activity Tolerance: Patient tolerated treatment well Patient left: in chair Nurse Communication: Mobility status PT Visit Diagnosis: Difficulty in walking, not elsewhere classified (R26.2);Pain Pain - Right/Left: Right Pain - part of body: Shoulder    Time: 1610-9604 PT Time Calculation (min) (ACUTE ONLY): 16 min   Charges:   PT Evaluation $PT Eval Low Complexity: 1 Low PT Treatments $Gait Training: 8-22 mins PT General Charges $$ ACUTE PT VISIT: 1 Visit         Elizabeth Palau, PT, DPT, GCS [phone]   Bradd Merlos 03/11/2023, 1:40 PM

## 2023-03-11 NOTE — Anesthesia Procedure Notes (Signed)
Anesthesia Regional Block: Interscalene brachial plexus block   Pre-Anesthetic Checklist: , timeout performed,  Correct Patient, Correct Site, Correct Laterality,  Correct Procedure, Correct Position, site marked,  Risks and benefits discussed,  Surgical consent,  Pre-op evaluation,  At surgeon's request and post-op pain management  Laterality: Right  Prep: chloraprep       Needles:  Injection technique: Single-shot  Needle Type: Echogenic Needle     Needle Length: 4cm  Needle Gauge: 25     Additional Needles:   Procedures:,,,, ultrasound used (permanent image in chart),,    Narrative:  Start time: 03/11/2023 7:30 AM End time: 03/11/2023 7:31 AM Injection made incrementally with aspirations every 5 mL.  Performed by: Personally  Anesthesiologist: Stephanie Coup, MD  Additional Notes: Patient's chart reviewed and they were deemed appropriate candidate for procedure, at surgeon's request. Patient educated about risks, benefits, and alternatives of the block including but not limited to: temporary or permanent nerve damage, bleeding, infection, damage to surround tissues, pneumothorax, hemidiaphragmatic paralysis, unilateral Horner's syndrome, block failure, local anesthetic toxicity. Patient expressed understanding. A formal time-out was conducted consistent with institution rules.  Monitors were applied, and minimal sedation used (see nursing record). The site was prepped with skin prep and allowed to dry, and sterile gloves were used. A high frequency linear ultrasound probe with probe cover was utilized throughout. C5-7 nerve roots located and appeared anatomically normal, local anesthetic injected around them, and echogenic block needle trajectory was monitored throughout. Aspiration performed every 5ml. Lung and blood vessels were avoided. All injections were performed without resistance and free of blood and paresthesias. The patient tolerated the procedure well.  Injectate:  20ml 0.5% bupivacaine

## 2023-03-11 NOTE — Anesthesia Procedure Notes (Signed)
Procedure Name: Intubation Date/Time: 03/11/2023 7:48 AM  Performed by: Rich Brave, CRNAPre-anesthesia Checklist: Patient identified, Emergency Drugs available, Suction available, Patient being monitored and Timeout performed Patient Re-evaluated:Patient Re-evaluated prior to induction Oxygen Delivery Method: Circle system utilized Preoxygenation: Pre-oxygenation with 100% oxygen Induction Type: IV induction Ventilation: Two handed mask ventilation required Laryngoscope Size: McGrath and 4 Grade View: Grade I Tube type: Oral Tube size: 7.5 mm Number of attempts: 1 Airway Equipment and Method: Stylet and Video-laryngoscopy Placement Confirmation: ETT inserted through vocal cords under direct vision, positive ETCO2 and breath sounds checked- equal and bilateral Secured at: 22 cm Tube secured with: Tape Dental Injury: Teeth and Oropharynx as per pre-operative assessment  Comments: Intubation uneventful; pt moved to OR table and positioned for surgery.  Noted ineffective tidal volumes, desaturation to 80s. Pt coughing.  ETT in place and secure.  Manually ventilating patient, suctioned, pre-emptive call for back-up anesthesia with successful increase in tidal volumes and oxygenation; pule oximeter reading greater than 95%.  Bronchodilator administered.  Pt stable, VSS, ventilator functioning properly; see record for meds given.

## 2023-03-11 NOTE — Discharge Instructions (Addendum)
Andrew Albee, MD  [facility]  Phone: [phone]  Fax: [phone]   Discharge Instructions after Reverse Shoulder Replacement    1. Activity/Sling: You are to be non-weight bearing on operative extremity. A sling/shoulder immobilizer has been provided for you. Only remove the sling to perform elbow, wrist, and hand RoM exercises and hygiene/dressing. Active reaching and lifting are not permitted. You will be given further instructions on sling use at your first physical therapy visit and postoperative visit with Dr. Allena Katz.   2. Dressings: Dressing may be removed at 1st physical therapy visit (~3-4 days after surgery). Afterwards, you may either leave open to air (if no drainage) or cover with dry, sterile dressing. If you have steri-strips on your wound, please do not remove them. They will fall off on their own. You may shower 5 days after surgery. Please pat incision dry. Do not rub or place any shear forces across incision. If there is drainage or any opening of incision after 5 days, please notify our offices immediately.    3. Driving:  Plan on not driving for six weeks. Please note that you are advised NOT to drive while taking narcotic pain medications as you may be impaired and unsafe to drive.   4. Medications:  - You have been provided a prescription for narcotic pain medicine (usually oxycodone). After surgery, take 1-2 narcotic tablets every 4 hours if needed for severe pain. Please start this as soon as you begin to start having pain (if you received a nerve block, start taking as soon as this wears off).  - A prescription for anti-nausea medication will be provided in case the narcotic medicine causes nausea - take 1 tablet every 6 hours only if nauseated.  - Take enteric coated aspirin 325 mg once daily for 6 weeks to prevent blood clots. Do not take aspirin if you have an aspirin sensitivity/allergy or asthma or are on an anticoagulant (blood thinner) already. If so, then  your home anticoagulant will be resume and managed - do not take aspirin. -Take tylenol 1000mg  (2 Extra strength or 3 regular strength tablets) every 8 hours for pain. This will reduce the amount of narcotic medication needed. May stop tylenol when you are having minimal pain. - Take a stool softener (Colace, Dulcolax or Senakot) if you are using narcotic pain medications to help with constipation that is associated with narcotic use. - DO NOT take ANY nonsteroidal anti-inflammatory pain medications: Advil, Motrin, Ibuprofen, Aleve, Naproxen, or Naprosyn.   If you are taking prescription medication for anxiety, depression, insomnia, muscle spasm, chronic pain, or for attention deficit disorder you are advised that you are at a higher risk of adverse effects with use of narcotics post-op, including narcotic addiction/dependence, depressed breathing, death. If you use non-prescribed substances: alcohol, marijuana, cocaine, heroin, methamphetamines, etc., you are at a higher risk of adverse effects with use of narcotics post-op, including narcotic addiction/dependence, depressed breathing, death. You are advised that taking > 50 morphine milligram equivalents (MME) of narcotic pain medication per day results in twice the risk of overdose or death. For your prescription provided: oxycodone 5 mg - taking more than 6 tablets per day after the first few days of surgery.   5. Physical Therapy: 1-2 times per week for ~12 weeks. Therapy typically starts on post operative Day 3 or 4. You have been provided an order for physical therapy. The therapist will provide home exercises. Please contact our offices if this appointment has not been scheduled.  6. Work: May do light duty/desk job in approximately 2 weeks when off of narcotics, pain is well-controlled, and swelling has decreased if able to function with one arm in sling. Full work may take 6 weeks if light motions and function of both arms is required.  Lifting jobs may require 12 weeks.   7. Post-Op Appointments: Your first post-op appointment will be with Dr. Allena Katz in approximately 2 weeks time.    If you find that they have not been scheduled please call the Orthopaedic Appointment front desk at [phone].                               Andrew Albee, MD [facility] Phone: [phone] Fax: [phone]   REVERSE SHOULDER ARTHROPLASTY REHAB GUIDELINES   These guidelines should be tailored to individual patients based on their rehab goals, age, precautions, quality of repair, etc.  Progression should be based on patient progress and approval by the referring physician.  PHASE 1 - Day 1 through Week 2  GENERAL GUIDELINES AND PRECAUTIONS Sling wear 24/7 except during grooming and home exercises (3 to 5 times daily) Avoid shoulder extension such that the arm is posterior the frontal plane.  When patients recline, a pillow should be placed behind the upper arm and sling should be on.  They should be advised to always be able to see the elbow Avoid combined IR/ADD/EXT, such as hand behind back to prevent dislocation Avoid combined IR and ADD such as reaching across the chest to prevent dislocation No AROM No submersion in pool/water for 4 weeks No weight bearing through operative arm (as in transfers, walker use, etc.)  GOALS Maintain integrity of joint replacement; protect soft tissue healing Increase PROM for elevation to 120 and ER to 30 (will remain the goal for first 6 weeks) Optimize distal UE circulation and muscle activity (elbow, wrist and hand) Instruct in use of sling for proper fit, polar care device for ice application after HEP, signs/symptoms of infection  EXERCISES Active elbow, wrist and hand Passive forward elevation in scapular plane to 90-120 max motion; ER in scapular plane to 30 Active scapular retraction with arms resting in neutral position  CRITERIA TO PROGRESS TO  PHASE 2 Low pain (less than 3/10) with shoulder PROM Healing of incision without signs of infection Clearance by MD to advance after 2 week MD check up  PHASE 2 - 2 weeks - 6 weeks  GENERAL GUIDELINES AND PRECAUTIONS Sling may be removed while at home; worn in community without abduction pillow May use arm for light activities of daily living (such as feeding, brushing teeth, dressing.) with elbow near  the side of the body  and arm in front of the body- no active lifting of the arm May submerge in water (tub, pool, Pelzer, etc.) after 4 weeks Continue to avoid WBing through the operative arm Continue to avoid combined IR/EXT/ADD (hand behind the back) and IR/ADD  (reaching across chest) for dislocation precautions  GOALS  Achieve passive elevation to 120 and ER to 30  Low (less than 3/10) to no pain  Ability to fire all heads of the deltoid  EXERCISES May discontinue grip, and active elbow and wrist exercises since using the arm in ADL's  with sling removed around the home Continue passive elevation to 120 and ER to 30, both in scapular plane with arm supported on table top Add submaximal isometrics, pain free effort, for all  functional heads of deltoid (anterior, posterior, middle)  Ensure that with posterior deltoid isometric the shoulder does not move into extension and the arm remains anterior the frontal plane At 4 weeks:  begin to place arm in balanced position of 90 deg elevation in supine; when patient able to hold this position with ease, may begin reverse pendulums clockwise and counterclockwise  CRITERIA TO PROGRESS TO PHASE 3 Passive forward elevation in scapular plane to 120; passive ER in scapular plane to 30 Ability to fire isometrically all heads of the deltoid muscle without pain Ability to place and hold the arm in balanced position (90 deg elevation in supine)  PHASE 3 - 6 weeks to 3 months  GENERAL GUIDELINES AND PRECAUTIONS Discontinue use of sling Avoid  forcing end range motion in any direction to prevent dislocation  May advance use of the arm actively in ADL's without being restricted to arm by the side of the body, however, avoid heavy lifting and sports (forever!) May initiate functional IR behind the back gently NO UPPER BODY ERGOMETER   GOALS Optimize PROM for elevation and ER in scapular plane with realistic expectation that max  mobility for elevation is usually around 145-160 passively; ER 40 to 50 passively; functional IR to L1 Recover AROM to approach as close to PROM available as possible; may expect 135-150 deg active elevation; 30 deg active ER; active functional IR to L1 Establish dynamic stability of the shoulder with deltoid and periscapular muscle gradual strengthening  EXERCISES Forward elevation in scapular plane active progression: supine to incline, to vertical; short to long lever arm Balanced position long lever arm AROM Active ER/IR with arm at side Scapular retraction with light band resistance Functional IR with hand slide up back - very gentle and gradual NO UPPER BODY ERGOMETER     CRITERIA TO PROGRESS TO PHASE 4  AROM equals/approaches PROM with good mechanics for elevation   No pain  Higher level demand on shoulder than ADL functions   PHASE 4 12 months and beyond  GENERAL GUIDELINES AND PRECAUTIONS No heavy lifting and no overhead sports No heavy pushing activity Gradually increase strength of deltoid and scapular stabilizers; also the rotator cuff if present with weights not to exceed 5 lbs NO UPPER BODY ERGOMETER   GOALS  Optimize functional use of the operative UE to meet the desired demands  Gradual increase in deltoid, scapular muscle, and rotator cuff strength  Pain free functional activities   EXERCISES Add light hand weights for deltoid up to and not to exceed 3 lbs for anterior and posterior with long arm lift against gravity; elbow bent to 90 deg for abduction in scapular  plane Theraband progression for extension to hip with scapular depression/retraction Theraband progression for serratus anterior punches in supine; avoid wall, incline or prone pressups for serratus anterior End range stretching gently without forceful overpressure in all planes (elevation in scapular plane, ER in scapular plane, functional IR) with stretching done for life as part of a daily routine NO UPPER BODY ERGOMETER     CRITERIA FOR DISCHARGE FROM SKILLED PHYSICAL THERAPY  Pain free AROM for shoulder elevation (expect around 135-150)  Functional strength for all ADL's, work tasks, and hobbies approved by Careers adviser  Independence with home maintenance program   NOTES: 1. With proper exercise, motion, strength, and function continue to improve even after one year. 2. The complication rate after surgery is 5 - 8%. Complications include infection, fracture, heterotopic bone formation, nerve injury, instability, rotator cuff  tear, and tuberosity nonunion. Please look for clinical signs, unusual symptoms, or lack of progress with therapy and report those to Dr. Allena Katz. Prefer more communication than less.  3. The therapy plan above only serves as a guide. Please be aware of specific individualized patient instructions as written on the prescription or through discussions with the surgeon. 4. Please call Dr. Allena Katz if you have any specific questions or concerns [phone]   POLAR CARE INFORMATION  MassAdvertisement.it  How to use [facility] Polar [facility]?  YouTube   ShippingScam.co.uk  OPERATING INSTRUCTIONS  Start the product With dry hands, connect the transformer to the electrical connection located on the top of the cooler. Next, plug the transformer into an appropriate electrical outlet. The unit will automatically start running at this point.  To stop the pump, disconnect electrical power.  Unplug to stop the product when not in use. Unplugging  the Polar Care unit turns it off. Always unplug immediately after use. Never leave it plugged in while unattended. Remove pad.    FIRST ADD WATER TO FILL LINE, THEN ICE---Replace ice when existing ice is almost melted  1 Discuss Treatment with your Licensed Health Care Practitioner and Use Only as Prescribed 2 Apply Insulation Barrier & Cold Therapy Pad 3 Check for Moisture 4 Inspect Skin Regularly  Tips and Trouble Shooting Usage Tips 1. Use cubed or chunked ice for optimal performance. 2. It is recommended to drain the Pad between uses. To drain the pad, hold the Pad upright with the hose pointed toward the ground. Depress the black plunger and allow water to drain out. 3. You may disconnect the Pad from the unit without removing the pad from the affected area by depressing the silver tabs on the hose coupling and gently pulling the hoses apart. The Pad and unit will seal itself and will not leak. Note: Some dripping during release is normal. 4. DO NOT RUN PUMP WITHOUT WATER! The pump in this unit is designed to run with water. Running the unit without water will cause permanent damage to the pump. 5. Unplug unit before removing lid.  TROUBLESHOOTING GUIDE Pump not running, Water not flowing to the pad, Pad is not getting cold 1. Make sure the transformer is plugged into the wall outlet. 2. Confirm that the ice and water are filled to the indicated levels. 3. Make sure there are no kinks in the pad. 4. Gently pull on the blue tube to make sure the tube/pad junction is straight. 5. Remove the pad from the treatment site and ll it while the pad is lying at; then reapply. 6. Confirm that the pad couplings are securely attached to the unit. Listen for the double clicks (Figure 1) to confirm the pad couplings are securely attached.  Leaks    Note: Some condensation on the lines, controller, and pads is unavoidable, especially in warmer climates. 1. If using a [employer] Polar Care Cold Therapy unit  with a detachable Cold Therapy Pad, and a leak exists (other than condensation on the lines) disconnect the pad couplings. Make sure the silver tabs on the couplings are depressed before reconnecting the pad to the pump hose; then confirm both sides of the coupling are properly clicked in. 2. If the coupling continues to leak or a leak is detected in the pad itself, stop using it and call [employer] Customer Care at [phone].  Cleaning After use, empty and dry the unit with a soft cloth. Warm water and mild  detergent may be used occasionally to clean the pump and tubes.  WARNING: The Polar Care Cube can be cold enough to cause serious injury, including full skin necrosis. Follow these Operating Instructions, and carefully read the Product Insert (see pouch on side of unit) and the Cold Therapy Pad Fitting Instructions (provided with each Cold Therapy Pad) prior to use.       SHOULDER SLING IMMOBILIZER   VIDEO Slingshot 2 Shoulder Brace Application - YouTube ---https://www.porter.info/  INSTRUCTIONS While supporting the injured arm, slide the forearm into the sling. Wrap the adjustable shoulder strap around the neck and shoulders and attach the strap end to the sling using  the "alligator strap tab."  Adjust the shoulder strap to the required length. Position the shoulder pad behind the neck. To secure the shoulder pad location (optional), pull the shoulder strap away from the shoulder pad, unfold the hook material on the top of the pad, then press the shoulder strap back onto the hook material to secure the pad in place. Attach the closure strap across the open top of the sling. Position the strap so that it holds the arm securely in the sling. Next, attach the thumb strap to the open end of the sling between the thumb and fingers. After sling has been fit, it may be easily removed and reapplied using the quick release buckle on shoulder strap. If a neutral pillow or 15  abduction pillow is included, place the pillow at the waistline. Attach the sling to the pillow, lining up hook material on the pillow with the loop on sling. Adjust the waist strap to fit.  If waist strap is too long, cut it to fit. Use the small piece of double sided hook material (located on top of the pillow) to secure the strap end. Place the double sided hook material on the inside of the cut strap end and secure it to the waist strap.     If no pillow is included, attach the waist strap to the sling and adjust to fit.    Washing Instructions: Straps and sling must be removed and cleaned regularly depending on your activity level and perspiration. Hand wash straps and sling in cold water with mild detergent, rinse, air dry

## 2023-03-11 NOTE — Anesthesia Postprocedure Evaluation (Signed)
Anesthesia Post Note  Patient: Andrew Whitehead  Procedure(s) Performed: Right reverse shoulder arthroplasty, biceps tenodesis (Right: Shoulder) Right reverse shoulder arthroplasty, biceps tenodesis (Right: Shoulder)  Patient location during evaluation: PACU Anesthesia Type: General Level of consciousness: awake and alert Pain management: pain level controlled Vital Signs Assessment: post-procedure vital signs reviewed and stable Respiratory status: spontaneous breathing, nonlabored ventilation, respiratory function stable and patient connected to nasal cannula oxygen Cardiovascular status: blood pressure returned to baseline and stable Postop Assessment: no apparent nausea or vomiting Anesthetic complications: no  No notable events documented.   Last Vitals:  Vitals:   03/11/23 1154 03/11/23 1312  BP: 130/73 126/83  Pulse: 80 93  Resp: (!) 22 20  Temp: (!) 36.3 C   SpO2: 92% 92%    Last Pain:  Vitals:   03/11/23 1312  TempSrc:   PainSc: 2                  Stephanie Coup

## 2023-03-11 NOTE — Op Note (Signed)
 SURGERY DATE: 03/11/2023   PRE-OP DIAGNOSIS:  1. Right shoulder rotator cuff arthropathy   POST-OP DIAGNOSIS:  1. Right shoulder rotator cuff arthropathy   PROCEDURES:  1. Right reverse total shoulder arthroplasty 2. Right biceps tenodesis   SURGEON: Rosealee Albee, MD  ASSISTANTS: Dedra Skeens, PA; Meade Maw, PA-S    ANESTHESIA: Gen + interscalene block   ESTIMATED BLOOD LOSS: 200cc   TOTAL IV FLUIDS: per anesthesia record  IMPLANTS: [employer]: RSP Glenoid Head w/Retaining screw 36N; Augmented (10 degree) Reverse Shoulder Baseplate with 6.1mm x 30mm central screw; 4 locking screws into baseplate; Standard Shell Short Humeral Stem 12 x 48mm; Neutral Small Socket Insert;    INDICATION(S):  Andrew Whitehead is a 58 y.o. male with chronic shoulder pain with inability to lift arm overhead. He had previously undergone biologic tuberoplasty by me on 09/17/22 as he did not wish to pursue RSA at that time and sought pain relief. He had continued symptoms of pain afterwards and elected to under go RSA as imaging was consistent with massive, irreparable rotator cuff tear. After discussion of risks, benefits, and alternatives to surgery, the patient elected to proceed with reverse shoulder arthroplasty and biceps tenodesis.   OPERATIVE FINDINGS: massive rotator cuff tear (complete supraspinatus, partial infraspinatus); biologic tuberoplasty graft not visualized   OPERATIVE REPORT:   I identified Andrew Whitehead in the pre-operative holding area. Informed consent was obtained and the surgical site was marked. I reviewed the risks and benefits of the proposed surgical intervention and the patient wished to proceed. An interscalene block with Exparel was administered by the Anesthesia team. The patient was transferred to the operative suite and general anesthesia was administered. The patient was placed in the beach chair position with the head of the bed elevated approximately 45 degrees. All  down side pressure points were appropriately padded. Pre-op exam under anesthesia confirmed some stiffness and crepitus. Appropriate IV antibiotics were administered. The extremity was then prepped and draped in standard fashion. A time out was performed confirming the correct extremity, correct patient, and correct procedure.   We used the standard deltopectoral incision from the coracoid to ~12cm distal. We found the cephalic vein and took it laterally. We opened the deltopectoral interval widely and placed retractors under the CA ligament in the subacromial space and under the deltoid tendon at its insertion. We then abducted and internally rotated the arm and released the underlying bursa between these retractors, taking care not to damage the circumflex branch of the axillary nerve.   Next, we brought the arm back in adduction at slight forward flexion with external rotation. We opened the clavipectoral fascia lateral to the conjoint tendon. We gently palpated the axillary nerve and verified its position and continuity on both sides of the humerus with a Tug test. This test was repeated multiple times during the procedure for nerve localization and confirmed to be intact at the end of the case. We then cauterized the anterior humeral circumflex ("Three sisters") vessels. The arm was then internally rotated, we cut the falciform ligament at approximately 1 cm of the upper portion of the pectoralis major insertion. Next we unroofed the bicipital groove. We proceeded with a soft tissue biceps tenodesis given the pathology (significant thickening and tendinopathy proximally) of the tendon.  After opening the biceps tendon sheath all the way to the supraglenoid tubercle, we performed a biceps tenodesis with two #2 TiCron sutures to the upper border of the pectoralis major. The proximal portion of  the tendon was excised.   At this point, we could see that the supraspinatus was completely torn with a bald  humeral head superiorly. Anterior infraspinatus was also torn. We performed a subscapularis peel using electrocautery to remove the anterior capsule and subscapularis off of the humeral head. We released the inferior capsule from the humerus all the way to the posterior band of the inferior glenohumeral ligament. When this was complete we gently dislocated the shoulder up into the wound. We removed any osteophytes and made our cut with the appropriate inclination in 20 degrees of retroversion. Prior anchors and sutures were removed.   We then turned our attention back to the glenoid. The proximal humerus was retracted posteriorly. The anterior capsule was dissected free from the subscapularis. The anterior capsule was then excised, exposing the anterior glenoid. We then grasped the labrum and removed it circumferentially. During the glenoid exposure, the axillary nerve was protected the entire time.    A patient-specific guide was used to drill the central guidepin. Reaming was performed such that the augment was located ~11 o'clock position. A cannulated tap was placed over the guidepin. Central hole was measured and above mentioned central screw was selected. The baseplate was inserted until appropriate contact was achieved with the glenoid. Central screw was placed and achieved excellent fixation such that the entire scapula rotated with further attempted seating of the baseplate. The peripheral screws were drilled, measured, and placed. The glenosphere was then placed and tightened.   We then turned our attention back to the humerus. We sized for a standard shell prosthesis. We sequentially used larger diameter canal finders until we met appropriate resistance and sequential broaching was performed to this size listed above. Trial poly inserts were placed. The humerus was trialed and noted to have satisfactory stability, motion, and deltoid tension with above listed poly. The trial implants were removed. 3  drill holes were placed about the lesser tuberosity footprint and FiberWire sutures were passed through these holes for subscapularis repair. Next, the implant was placed with the appropriate retroversion. Stability was confirmed. We placed the actual poly insert. The humerus was reduced and motion, tension, and stability were satisfactory. A Hemovac drain was placed. Subscapularis was repaired with the previously passed #2 FiberWire sutures after passing them through the subscapularis.   We again verified the tension on the axillary nerve, appropriate range of motion, stability of the implant, and security of the subscapularis repair. We closed the deltopectoral interval deep to the cephalic vein with a running, 0-Vicryl suture. The skin was closed with 2-0 Vicryl, 4-0 Monocryl, and Dermabond. Sterile dressings were applied. A PolarCare unit and sling were placed. Patient was extubated, transferred to a stretcher bed and to the post antesthesia care unit in stable condition.   Of note, assistance from a PA was essential to performing the surgery.  PA was present for the entire surgery.  PA assisted with patient positioning, retraction, instrumentation, and wound closure. The surgery would have been more difficult and had longer operative time without PA assistance.    POSTOPERATIVE PLAN: The patient will be discharged home. Operative arm to remain in sling at all times except RoM exercises and hygiene. Can perform pendulums, elbow/wrist/hand RoM exercises. Passive RoM allowed to 90 FF and 30 ER. ASA 325mg  x 6 weeks for DVT ppx. Plan for PT starting on POD #3-4. Patient to return to clinic in ~2 weeks for post-operative appointment. \

## 2023-03-11 NOTE — Anesthesia Preprocedure Evaluation (Addendum)
Anesthesia Evaluation  Patient identified by MRN, date of birth, ID band Patient awake    Reviewed: Allergy & Precautions, NPO status , Patient's Chart, lab work & pertinent test results  History of Anesthesia Complications Negative for: history of anesthetic complications  Airway Mallampati: III  TM Distance: >3 FB Neck ROM: full    Dental  (+) Dental Advidsory Given, Teeth Intact   Pulmonary neg COPD, Current SmokerPatient did not abstain from smoking.   Pulmonary exam normal        Cardiovascular hypertension, On Medications (-) angina Normal cardiovascular exam     Neuro/Psych  PSYCHIATRIC DISORDERS  Depression    negative neurological ROS     GI/Hepatic Neg liver ROS,GERD  Medicated and Controlled,,  Endo/Other  diabetes    Renal/GU      Musculoskeletal   Abdominal   Peds  Hematology negative hematology ROS (+)   Anesthesia Other Findings Patient had previous shoulder surgery in August and a block was placed. Patient received good pain control for 4 to 5 days but states he had a hard time breathing. States that it was uncomfortable not feeling like he could gather his breathe. Discussed doing the nerve block again today. Patient declined, stating he didn't want to feel like he was short of breathe. Discussed increased post op pain. Patient stated he was aware and would proceed without the block. After discussion with the surgeon, patient wanted to proceed with the nerve block but without exparel. Explained that the block would not last as long but he still had the same chance of difficulty breathing for as long as the block was working. Patient stated he understood and agreed to proceed with just bupivacaine.   Past Medical History: No date: Depression No date: Gastro-esophageal reflux disease with esophagitis No date: GERD (gastroesophageal reflux disease) No date: Hypercholesterolemia No date: Hypertension No  date: MRSA infection     Comment:  thigh and tear duct No date: Obesity No date: Tobacco abuse No date: Type 2 diabetes mellitus without complication (HCC)  Past Surgical History: No date: ABDOMINAL HYSTERECTOMY No date: HERNIA REPAIR  BMI    Body Mass Index: 36.62 kg/m      Reproductive/Obstetrics negative OB ROS                             Anesthesia Physical Anesthesia Plan  ASA: 2  Anesthesia Plan: General ETT   Post-op Pain Management: Dilaudid IV   Induction: Intravenous  PONV Risk Score and Plan: 3 and Ondansetron, Dexamethasone and Midazolam  Airway Management Planned: Oral ETT  Additional Equipment:   Intra-op Plan:   Post-operative Plan: Extubation in OR  Informed Consent: I have reviewed the patients History and Physical, chart, labs and discussed the procedure including the risks, benefits and alternatives for the proposed anesthesia with the patient or authorized representative who has indicated his/her understanding and acceptance.     Dental Advisory Given  Plan Discussed with: Anesthesiologist, CRNA and Surgeon  Anesthesia Plan Comments: (Patient consented for risks of anesthesia including but not limited to:  - adverse reactions to medications - damage to eyes, teeth, lips or other oral mucosa - nerve damage due to positioning  - sore throat or hoarseness - Damage to heart, brain, nerves, lungs, other parts of body or loss of life  Patient voiced understanding and assent.)        Anesthesia Quick Evaluation

## 2023-03-11 NOTE — Evaluation (Signed)
Occupational Therapy Evaluation Patient Details Name: Andrew Whitehead MRN: [id number] DOB: 1965-03-18 Today's Date: 03/11/2023   History of Present Illness   History of Present Illness: Pt admitted for R reverse shoulder surgery with biceps tenodesis. Pt is POD 0 at time of evaluation.     Clinical Impressions Pt greeted in recliner, partner Marylu Lund present for education. Pt is alert and oriented x4, agreeable to OT evaluation. PTA pt is generally MOD I-I in ADL/IADL, amb with no AD. Pt and partner provided education re: polar care mgt, sling/immobilizer mgt, ROM exercises for RUE, RUE precautions, adaptive strategies for bathing/dressing/toileting/grooming, positioning and considerations for sleep, and home/routines modifications to maximize falls prevention, safety, and independence. Handout provided. OT adjusted sling/immobilizer and polar care to improve comfort, optimize positioning, and to maximize skin integrity/safety. Pt partner able to perform with MIN A, pt amb with no AD 100' with supervision. Pt verbalized understanding of all education/training provided. Pt will benefit from skilled OT services to address these limitations and improve independence in daily tasks.     If plan is discharge home, recommend the following:   A little help with bathing/dressing/bathroom     Functional Status Assessment   Patient has had a recent decline in their functional status and demonstrates the ability to make significant improvements in function in a reasonable and predictable amount of time.     Equipment Recommendations   None recommended by OT     Recommendations for Other Services         Precautions/Restrictions   Precautions Precautions: Fall;Shoulder Shoulder Interventions: Shoulder sling/immobilizer;At all times;Off for dressing/bathing/exercises Precaution Booklet Issued: Yes (comment) Recall of Precautions/Restrictions: Intact Required Braces or Orthoses:  Sling Restrictions Weight Bearing Restrictions Per Provider Order: Yes RUE Weight Bearing Per Provider Order: Non weight bearing     Mobility Bed Mobility               General bed mobility comments: NT in recliner pre/post session    Transfers Overall transfer level: Modified independent                        Balance Overall balance assessment: Needs assistance Sitting-balance support: Feet supported Sitting balance-Leahy Scale: Normal       Standing balance-Leahy Scale: Good                             ADL either performed or assessed with clinical judgement   ADL Overall ADL's : Needs assistance/impaired Eating/Feeding: Sitting;Set up   Grooming: Set up           Upper Body Dressing : Maximal assistance Upper Body Dressing Details (indicate cue type and reason): donn/doff sling; partner performs with vcs, min assist from therapist Lower Body Dressing: Minimal assistance;Sitting/lateral leans;Sit to/from stand Lower Body Dressing Details (indicate cue type and reason): anticipate Toilet Transfer: Supervision/safety Toilet Transfer Details (indicate cue type and reason): simulated no AD         Functional mobility during ADLs: Supervision/safety (approx 100' no AD)       Vision Patient Visual Report: No change from baseline       Perception         Praxis         Pertinent Vitals/Pain Pain Assessment Pain Assessment: No/denies pain     Extremity/Trunk Assessment Upper Extremity Assessment Upper Extremity Assessment: Right hand dominant;RUE deficits/detail (LUE WFL) RUE Deficits / Details: shoulder NT; elbow  AROM 1/4 full AROM, wrist flexion/extension appears WFL, grip strength WFL; elbow/wrist/hand PROM all WFL RUE Coordination: decreased fine motor;decreased gross motor   Lower Extremity Assessment Lower Extremity Assessment: Overall WFL for tasks assessed   Cervical / Trunk Assessment Cervical / Trunk  Assessment: Normal   Communication Communication Communication: No apparent difficulties   Cognition Arousal: Alert Behavior During Therapy: WFL for tasks assessed/performed Cognition: No apparent impairments                               Following commands: Intact       Cueing  General Comments   Cueing Techniques: Verbal cues      Exercises     Shoulder Instructions      Home Living Family/patient expects to be discharged to:: Private residence Living Arrangements: Spouse/significant other;Other relatives Available Help at Discharge: Family Type of Home: House Home Access: Stairs to enter Secretary/administrator of Steps: 2 Entrance Stairs-Rails: None Home Layout: One level     Bathroom Shower/Tub: Producer, television/film/video: Standard     Home Equipment: None          Prior Functioning/Environment Prior Level of Function : Independent/Modified Independent             Mobility Comments: reports he has not had any falls and is currently not working at this time ADLs Comments: indep    OT Problem List: Decreased activity tolerance;Decreased knowledge of use of DME or AE;Impaired UE functional use   OT Treatment/Interventions: Self-care/ADL training;DME and/or AE instruction;Therapeutic activities;Balance training;Therapeutic exercise;Energy conservation;Patient/family education      OT Goals(Current goals can be found in the care plan section)   Acute Rehab OT Goals Patient Stated Goal: go home OT Goal Formulation: With patient/family Time For Goal Achievement: 03/25/23 Potential to Achieve Goals: Good   OT Frequency:  Min 1X/week    Co-evaluation              AM-PAC OT "6 Clicks" Daily Activity     Outcome Measure Help from another person eating meals?: None Help from another person taking care of personal grooming?: None Help from another person toileting, which includes using toliet, bedpan, or urinal?:  None Help from another person bathing (including washing, rinsing, drying)?: A Little Help from another person to put on and taking off regular upper body clothing?: A Lot Help from another person to put on and taking off regular lower body clothing?: A Little 6 Click Score: 20   End of Session Nurse Communication: Mobility status  Activity Tolerance: Patient tolerated treatment well Patient left: in chair;with call bell/phone within reach;with family/visitor present  OT Visit Diagnosis: Other abnormalities of gait and mobility (R26.89)                Time: 1914-7829 OT Time Calculation (min): 19 min Charges:  OT General Charges $OT Visit: 1 Visit OT Evaluation $OT Eval Low Complexity: 1 Low  Oleta Mouse, OTD OTR/L  03/11/23, 3:59 PM

## 2023-03-11 NOTE — Progress Notes (Signed)
Hemovac tubing was removed with no complications.  Patient stable with pain control.  Able to discharge home.

## 2023-03-11 NOTE — Transfer of Care (Signed)
Immediate Anesthesia Transfer of Care Note  Patient: Andrew Whitehead  Procedure(s) Performed: Right reverse shoulder arthroplasty, biceps tenodesis (Right: Shoulder) Right reverse shoulder arthroplasty, biceps tenodesis (Right: Shoulder)  Patient Location: PACU  Anesthesia Type:General and Regional  Level of Consciousness: awake and patient cooperative  Airway & Oxygen Therapy: Patient Spontanous Breathing and Patient connected to face mask oxygen  Post-op Assessment: Report given to RN and Post -op Vital signs reviewed and stable  Post vital signs: Reviewed and stable  Last Vitals:  Vitals Value Taken Time  BP 139/79 03/11/23 1045  Temp 36.4 C 03/11/23 1045  Pulse 80 03/11/23 1045  Resp 21 03/11/23 1045  SpO2      Last Pain:  Vitals:   03/11/23 0636  TempSrc: Tympanic  PainSc: 3          Complications: No notable events documented.

## 2023-03-11 NOTE — H&P (Signed)
Paper H&P to be scanned into permanent record. H&P reviewed. No significant changes noted.

## 2023-03-13 ENCOUNTER — Encounter: Payer: Self-pay | Admitting: Orthopedic Surgery

## 2023-11-21 DIAGNOSIS — E7800 Pure hypercholesterolemia, unspecified: Secondary | ICD-10-CM | POA: Diagnosis not present

## 2023-11-21 DIAGNOSIS — Z794 Long term (current) use of insulin: Secondary | ICD-10-CM | POA: Diagnosis not present

## 2023-11-21 DIAGNOSIS — E119 Type 2 diabetes mellitus without complications: Secondary | ICD-10-CM | POA: Diagnosis not present

## 2023-11-21 DIAGNOSIS — I10 Essential (primary) hypertension: Secondary | ICD-10-CM | POA: Diagnosis not present

## 2023-12-12 DIAGNOSIS — Z23 Encounter for immunization: Secondary | ICD-10-CM | POA: Diagnosis not present

## 2023-12-12 DIAGNOSIS — F32A Depression, unspecified: Secondary | ICD-10-CM | POA: Diagnosis not present

## 2023-12-12 DIAGNOSIS — Z1331 Encounter for screening for depression: Secondary | ICD-10-CM | POA: Diagnosis not present

## 2023-12-12 DIAGNOSIS — K219 Gastro-esophageal reflux disease without esophagitis: Secondary | ICD-10-CM | POA: Diagnosis not present

## 2023-12-12 DIAGNOSIS — E7800 Pure hypercholesterolemia, unspecified: Secondary | ICD-10-CM | POA: Diagnosis not present

## 2023-12-12 DIAGNOSIS — I10 Essential (primary) hypertension: Secondary | ICD-10-CM | POA: Diagnosis not present

## 2023-12-12 DIAGNOSIS — Z720 Tobacco use: Secondary | ICD-10-CM | POA: Diagnosis not present

## 2023-12-12 DIAGNOSIS — Z794 Long term (current) use of insulin: Secondary | ICD-10-CM | POA: Diagnosis not present

## 2023-12-12 DIAGNOSIS — I159 Secondary hypertension, unspecified: Secondary | ICD-10-CM | POA: Diagnosis not present

## 2023-12-12 DIAGNOSIS — Z125 Encounter for screening for malignant neoplasm of prostate: Secondary | ICD-10-CM | POA: Diagnosis not present

## 2023-12-12 DIAGNOSIS — E119 Type 2 diabetes mellitus without complications: Secondary | ICD-10-CM | POA: Diagnosis not present

## 2023-12-12 DIAGNOSIS — Z0000 Encounter for general adult medical examination without abnormal findings: Secondary | ICD-10-CM | POA: Diagnosis not present

## 2023-12-12 DIAGNOSIS — E669 Obesity, unspecified: Secondary | ICD-10-CM | POA: Diagnosis not present
